# Patient Record
Sex: Female | Born: 1965 | Race: White | Hispanic: No | Marital: Married | Smoking: Never smoker
Health system: Southern US, Community
[De-identification: ages and names within clinical notes are randomized; demographics above are authoritative.]

## PROBLEM LIST (undated history)

## (undated) DIAGNOSIS — F32A Depression, unspecified: Secondary | ICD-10-CM

## (undated) DIAGNOSIS — N329 Bladder disorder, unspecified: Secondary | ICD-10-CM

## (undated) DIAGNOSIS — F329 Major depressive disorder, single episode, unspecified: Secondary | ICD-10-CM

## (undated) DIAGNOSIS — D649 Anemia, unspecified: Secondary | ICD-10-CM

## (undated) DIAGNOSIS — R51 Headache: Secondary | ICD-10-CM

## (undated) DIAGNOSIS — E785 Hyperlipidemia, unspecified: Secondary | ICD-10-CM

## (undated) DIAGNOSIS — R519 Headache, unspecified: Secondary | ICD-10-CM

## (undated) DIAGNOSIS — Z90710 Acquired absence of both cervix and uterus: Secondary | ICD-10-CM

## (undated) DIAGNOSIS — I10 Essential (primary) hypertension: Secondary | ICD-10-CM

## (undated) DIAGNOSIS — R251 Tremor, unspecified: Secondary | ICD-10-CM

## (undated) DIAGNOSIS — F419 Anxiety disorder, unspecified: Secondary | ICD-10-CM

## (undated) DIAGNOSIS — R300 Dysuria: Secondary | ICD-10-CM

## (undated) HISTORY — DX: Dysuria: R30.0

## (undated) HISTORY — DX: Major depressive disorder, single episode, unspecified: F32.9

## (undated) HISTORY — DX: Hyperlipidemia, unspecified: E78.5

## (undated) HISTORY — PX: TUBAL LIGATION: SHX77

## (undated) HISTORY — DX: Tremor, unspecified: R25.1

## (undated) HISTORY — PX: CYSTOSCOPY: SUR368

## (undated) HISTORY — PX: OTHER SURGICAL HISTORY: SHX169

---

## 2014-12-16 NOTE — Patient Instructions (Addendum)
Your procedure is scheduled on:  Monday, NOV. 21, 2016  Enter through the Main Entrance of [facility] at: 6:00 A.M.  Pick up the phone at the desk and dial [phone].  Call this number if you have problems the morning of surgery: [phone].  Remember: Do NOT eat food or drink after:  MIDNIGHT SUNDAY Take these medicines the morning of surgery with a SIP OF WATER: blood pressure medication, effexor  *stop taking fish oil at this time.  Do NOT wear jewelry (body piercing), metal hair clips/bobby pins, make-up, or nail polish. Do NOT wear lotions, powders, or perfumes.  You may wear deoderant. Do NOT shave for 48 hours prior to surgery. Do NOT bring valuables to the hospital. Contacts, dentures, or bridgework may not be worn into surgery. Leave suitcase in car.  After surgery it may be brought to your room.  For patients admitted to the hospital, checkout time is 11:00 AM the day of discharge.

## 2014-12-17 ENCOUNTER — Encounter (HOSPITAL_COMMUNITY)
Admission: RE | Admit: 2014-12-17 | Discharge: 2014-12-17 | Disposition: A | Discharge status: Home / Self Care | Payer: 59 | Source: Ambulatory Visit | Attending: Obstetrics and Gynecology | Admitting: Obstetrics and Gynecology

## 2014-12-17 ENCOUNTER — Other Ambulatory Visit: Payer: Self-pay

## 2014-12-17 ENCOUNTER — Encounter (HOSPITAL_COMMUNITY): Payer: Self-pay

## 2014-12-17 DIAGNOSIS — D259 Leiomyoma of uterus, unspecified: Secondary | ICD-10-CM | POA: Insufficient documentation

## 2014-12-17 DIAGNOSIS — N926 Irregular menstruation, unspecified: Secondary | ICD-10-CM | POA: Diagnosis not present

## 2014-12-17 DIAGNOSIS — Z01818 Encounter for other preprocedural examination: Secondary | ICD-10-CM | POA: Diagnosis not present

## 2014-12-17 HISTORY — DX: Anxiety disorder, unspecified: F41.9

## 2014-12-17 HISTORY — DX: Essential (primary) hypertension: I10

## 2014-12-17 HISTORY — DX: Major depressive disorder, single episode, unspecified: F32.9

## 2014-12-17 HISTORY — DX: Anemia, unspecified: D64.9

## 2014-12-17 HISTORY — DX: Headache, unspecified: R51.9

## 2014-12-17 HISTORY — DX: Headache: R51

## 2014-12-17 HISTORY — DX: Depression, unspecified: F32.A

## 2014-12-17 HISTORY — DX: Bladder disorder, unspecified: N32.9

## 2014-12-17 LAB — COMPREHENSIVE METABOLIC PANEL
ALBUMIN: 4.3 g/dL (ref 3.5–5.0)
ALT: 16 U/L (ref 14–54)
ANION GAP: 6 (ref 5–15)
AST: 21 U/L (ref 15–41)
Alkaline Phosphatase: 68 U/L (ref 38–126)
BUN: 16 mg/dL (ref 6–20)
CHLORIDE: 104 mmol/L (ref 101–111)
CO2: 29 mmol/L (ref 22–32)
Calcium: 9.9 mg/dL (ref 8.9–10.3)
Creatinine, Ser: 0.81 mg/dL (ref 0.44–1.00)
GFR calc Af Amer: >60 mL/min (ref >60)
GFR calc non Af Amer: >60 mL/min (ref >60)
GLUCOSE: 81 mg/dL (ref 65–99)
POTASSIUM: 3.7 mmol/L (ref 3.5–5.1)
Sodium: 139 mmol/L (ref 135–145)
Total Bilirubin: 0.7 mg/dL (ref 0.3–1.2)
Total Protein: 8 g/dL (ref 6.5–8.1)

## 2014-12-17 LAB — CBC
HEMATOCRIT: 39.1 % (ref 36.0–46.0)
HEMOGLOBIN: 13.1 g/dL (ref 12.0–15.0)
MCH: 30.1 pg (ref 26.0–34.0)
MCHC: 33.5 g/dL (ref 30.0–36.0)
MCV: 89.9 fL (ref 78.0–100.0)
Platelets: 317 10*3/uL (ref 150–400)
RBC: 4.35 MIL/uL (ref 3.87–5.11)
RDW: 14.5 % (ref 11.5–15.5)
WBC: 6.7 10*3/uL (ref 4.0–10.5)

## 2014-12-17 NOTE — Pre-Procedure Instructions (Signed)
Dr. Royce Macadamia reviewed EKG from today's PAT visit and the prior EKG done 10/16 at patient's doctors office.  No new orders received proceed with surgery as scheduled.

## 2014-12-20 NOTE — Anesthesia Preprocedure Evaluation (Addendum)
Anesthesia Evaluation  Patient identified by MRN, date of birth, ID band Patient awake    Reviewed: Allergy & Precautions, H&P , NPO status , Patient's Chart, lab work & pertinent test results  Airway Mallampati: I  TM Distance: >3 FB Neck ROM: full    Dental no notable dental hx.    Pulmonary neg pulmonary ROS,    Pulmonary exam normal        Cardiovascular hypertension, Pt. on medications Normal cardiovascular exam     Neuro/Psych    GI/Hepatic negative GI ROS, Neg liver ROS,   Endo/Other  negative endocrine ROS  Renal/GU negative Renal ROS     Musculoskeletal   Abdominal Normal abdominal exam  (+)   Peds  Hematology   Anesthesia Other Findings   Reproductive/Obstetrics                            Anesthesia Physical Anesthesia Plan  ASA: II  Anesthesia Plan: General   Post-op Pain Management:    Induction: Intravenous  Airway Management Planned: Oral ETT  Additional Equipment:   Intra-op Plan:   Post-operative Plan: Extubation in OR  Informed Consent: I have reviewed the patients History and Physical, chart, labs and discussed the procedure including the risks, benefits and alternatives for the proposed anesthesia with the patient or authorized representative who has indicated his/her understanding and acceptance.     Plan Discussed with: CRNA and Surgeon  Anesthesia Plan Comments: (Discussed case with Dr. Sandford Craze. Unnecessary preop EKG returned with variable readings involving right axis shift, possible ST changes, however these changes were not contiguous in successive leads, she has not heart history and no risk factors for CAD except HTN. She has no cardiac symptoms (denies shortness of breath, chest pain with exertion, syncope, etc.) per Dr. Sandford Craze and has METS >4. Per AHA/ACC guidelines it is safe to proceed with elective case.  Veatrice Kells, MD)         Anesthesia Quick Evaluation

## 2014-12-22 NOTE — H&P (Signed)
Betty Nolan is an 49 y.o. female G40P2 with AUB, h/o cervical polyp and - someAUB, uterine fibroids for definitive management - Also c/o pelvic relaxation.  Desires LAVH, Bilateral Salpingectomy and A&P repair.  Recent negative EMB, removal of cervical polyp.  US reveals 4x5 cm fibroid ans 2x2cm fibroid.    Pertinent Gynecological History: Menses: AUB Bleeding: dysfunctional uterine bleeding Contraception: tubal ligation Sexually transmitted diseases: no past history Previous GYN Procedures: BTL  Last mammogram: normal Date: 4/16 Last pap: normal  OB History: G2, P2   Menstrual History: No LMP recorded.    Past Medical History  Diagnosis Date  . Anemia   . Hypertension   . Depression   . Anxiety   . Bladder problem     Small bladder since childhood, Cystoscope times two  . Headache     Migraines    Past Surgical History  Procedure Laterality Date  . Cystoscopy    . Left foot surgery Left     secondary to fracture  . Tubal ligation     FH: DM, CVA, MI, HTN  Social History:  reports that she has never smoked. She has never used smokeless tobacco. She reports that she drinks alcohol. She reports that she does not use illicit drugs.married  Allergies:  Allergies  Allergen Reactions  . Percocet [Oxycodone-Acetaminophen] Itching    Meds Ca+ cranberry, fish oil, MVI, Ibuprofen, Lisinopril 10/HCTZ 12.5, Effexor ER 75mg    Review of Systems  Constitutional: Negative.   Eyes: Negative.   Respiratory: Negative.   Cardiovascular: Negative.   Gastrointestinal: Positive for abdominal pain.  Genitourinary: Negative.   Musculoskeletal: Negative.   Skin: Negative.   Neurological: Negative.   Psychiatric/Behavioral:       Depression/anxiety - controlled w meds    There were no vitals taken for this visit. Physical Exam  Constitutional: She is oriented to person, place, and time. She appears well-developed and well-nourished.  HENT:  Head: Normocephalic and atraumatic.   Cardiovascular: Normal rate and regular rhythm.   Respiratory: Effort normal and breath sounds normal. No respiratory distress. She has no wheezes.  GI: Soft. Bowel sounds are normal. She exhibits no distension. There is no tenderness.  Musculoskeletal: Normal range of motion.  Neurological: She is alert and oriented to person, place, and time.  Skin: Skin is warm and dry.  Psychiatric: She has a normal mood and affect. Her behavior is normal.    Benign EMB, cervical polyp Nl Ur Cx.  Assessment/Plan: 49yo G2P2 with AUB, polyps For LAVH/BS/A&P repair Ancef for prophylaxis D/W pt r/b/a of surgery, wishes to proceed  Bovard-Stuckert, Khalila Buechner 12/22/2014, 9:12 PM

## 2014-12-23 ENCOUNTER — Encounter (HOSPITAL_COMMUNITY)
Admission: RE | Disposition: A | Discharge status: Home / Self Care | Payer: Self-pay | Source: Ambulatory Visit | Attending: Obstetrics and Gynecology

## 2014-12-23 ENCOUNTER — Observation Stay (HOSPITAL_COMMUNITY)
Admission: RE | Admit: 2014-12-23 | Discharge: 2014-12-24 | Disposition: A | Discharge status: Home / Self Care | Payer: 59 | Source: Ambulatory Visit | Attending: Obstetrics and Gynecology | Admitting: Obstetrics and Gynecology

## 2014-12-23 ENCOUNTER — Ambulatory Visit (HOSPITAL_COMMUNITY): Payer: 59 | Admitting: Anesthesiology

## 2014-12-23 ENCOUNTER — Encounter (HOSPITAL_COMMUNITY): Payer: Self-pay

## 2014-12-23 DIAGNOSIS — D259 Leiomyoma of uterus, unspecified: Secondary | ICD-10-CM | POA: Insufficient documentation

## 2014-12-23 DIAGNOSIS — Z79899 Other long term (current) drug therapy: Secondary | ICD-10-CM | POA: Diagnosis not present

## 2014-12-23 DIAGNOSIS — N879 Dysplasia of cervix uteri, unspecified: Secondary | ICD-10-CM | POA: Insufficient documentation

## 2014-12-23 DIAGNOSIS — Z90710 Acquired absence of both cervix and uterus: Secondary | ICD-10-CM

## 2014-12-23 DIAGNOSIS — F329 Major depressive disorder, single episode, unspecified: Secondary | ICD-10-CM | POA: Insufficient documentation

## 2014-12-23 DIAGNOSIS — I10 Essential (primary) hypertension: Secondary | ICD-10-CM | POA: Diagnosis not present

## 2014-12-23 DIAGNOSIS — F419 Anxiety disorder, unspecified: Secondary | ICD-10-CM | POA: Diagnosis not present

## 2014-12-23 DIAGNOSIS — N888 Other specified noninflammatory disorders of cervix uteri: Principal | ICD-10-CM | POA: Insufficient documentation

## 2014-12-23 DIAGNOSIS — Z885 Allergy status to narcotic agent status: Secondary | ICD-10-CM | POA: Diagnosis not present

## 2014-12-23 HISTORY — DX: Acquired absence of both cervix and uterus: Z90.710

## 2014-12-23 HISTORY — PX: LAPAROSCOPIC VAGINAL HYSTERECTOMY WITH SALPINGECTOMY: SHX6680

## 2014-12-23 HISTORY — PX: ANTERIOR AND POSTERIOR REPAIR: SHX5121

## 2014-12-23 LAB — PREGNANCY, URINE: PREG TEST UR: NEGATIVE

## 2014-12-23 SURGERY — HYSTERECTOMY, VAGINAL, LAPAROSCOPY-ASSISTED, WITH SALPINGECTOMY
Anesthesia: General | Site: Vagina

## 2014-12-23 MED ORDER — PROMETHAZINE HCL 25 MG/ML IJ SOLN: 6.2500 mg | INTRAMUSCULAR | Status: DC | PRN

## 2014-12-23 MED ORDER — NALOXONE HCL 0.4 MG/ML IJ SOLN: 0.4000 mg | INTRAMUSCULAR | Status: DC | PRN

## 2014-12-23 MED ORDER — IBUPROFEN 800 MG PO TABS
800.0000 mg | ORAL_TABLET | Freq: Three times a day (TID) | ORAL | Status: DC | PRN
  Administered 2014-12-23 – 2014-12-24 (×2): 800 mg via ORAL
  Filled 2014-12-23 (×2): qty 1

## 2014-12-23 MED ORDER — NEOSTIGMINE METHYLSULFATE 10 MG/10ML IV SOLN
INTRAVENOUS | Status: AC
  Filled 2014-12-23: qty 1

## 2014-12-23 MED ORDER — ONDANSETRON HCL 4 MG/2ML IJ SOLN: 4.0000 mg | Freq: Four times a day (QID) | INTRAMUSCULAR | Status: DC | PRN

## 2014-12-23 MED ORDER — DIPHENHYDRAMINE HCL 12.5 MG/5ML PO ELIX: 12.5000 mg | ORAL_SOLUTION | Freq: Four times a day (QID) | ORAL | Status: DC | PRN

## 2014-12-23 MED ORDER — FENTANYL CITRATE (PF) 100 MCG/2ML IJ SOLN
INTRAMUSCULAR | Status: AC
  Filled 2014-12-23: qty 2

## 2014-12-23 MED ORDER — DEXAMETHASONE SODIUM PHOSPHATE 10 MG/ML IJ SOLN
INTRAMUSCULAR | Status: DC | PRN
  Administered 2014-12-23: 4 mg via INTRAVENOUS

## 2014-12-23 MED ORDER — SODIUM CHLORIDE 0.9 % IJ SOLN
INTRAMUSCULAR | Status: DC | PRN
  Administered 2014-12-23: 10 mL

## 2014-12-23 MED ORDER — HYDROCODONE-ACETAMINOPHEN 5-325 MG PO TABS
1.0000 | ORAL_TABLET | ORAL | Status: DC | PRN
  Administered 2014-12-23 – 2014-12-24 (×3): 1 via ORAL
  Filled 2014-12-23 (×3): qty 1

## 2014-12-23 MED ORDER — CEFAZOLIN SODIUM-DEXTROSE 2-3 GM-% IV SOLR
2.0000 g | INTRAVENOUS | Status: AC
  Administered 2014-12-23: 2 g via INTRAVENOUS

## 2014-12-23 MED ORDER — LACTATED RINGERS IV SOLN
INTRAVENOUS | Status: DC
  Administered 2014-12-23 (×4): via INTRAVENOUS

## 2014-12-23 MED ORDER — VENLAFAXINE HCL ER 75 MG PO CP24
75.0000 mg | ORAL_CAPSULE | Freq: Every day | ORAL | Status: DC
  Administered 2014-12-24: 75 mg via ORAL
  Filled 2014-12-23: qty 1

## 2014-12-23 MED ORDER — MENTHOL 3 MG MT LOZG: 1.0000 | LOZENGE | OROMUCOSAL | Status: DC | PRN

## 2014-12-23 MED ORDER — SCOPOLAMINE 1 MG/3DAYS TD PT72
1.0000 | MEDICATED_PATCH | Freq: Once | TRANSDERMAL | Status: DC
  Administered 2014-12-23: 1.5 mg via TRANSDERMAL

## 2014-12-23 MED ORDER — SIMETHICONE 80 MG PO CHEW: 80.0000 mg | CHEWABLE_TABLET | Freq: Four times a day (QID) | ORAL | Status: DC | PRN

## 2014-12-23 MED ORDER — GLYCOPYRROLATE 0.2 MG/ML IJ SOLN
INTRAMUSCULAR | Status: AC
  Filled 2014-12-23: qty 3

## 2014-12-23 MED ORDER — KETOROLAC TROMETHAMINE 30 MG/ML IJ SOLN
INTRAMUSCULAR | Status: AC
  Filled 2014-12-23: qty 1

## 2014-12-23 MED ORDER — DIPHENHYDRAMINE HCL 25 MG PO CAPS
25.0000 mg | ORAL_CAPSULE | Freq: Four times a day (QID) | ORAL | Status: DC | PRN
  Administered 2014-12-23 – 2014-12-24 (×2): 25 mg via ORAL
  Filled 2014-12-23 (×2): qty 1

## 2014-12-23 MED ORDER — PROPOFOL 10 MG/ML IV BOLUS
INTRAVENOUS | Status: DC | PRN
  Administered 2014-12-23: 180 mg via INTRAVENOUS

## 2014-12-23 MED ORDER — LACTATED RINGERS IV SOLN
INTRAVENOUS | Status: DC
  Administered 2014-12-23 – 2014-12-24 (×2): via INTRAVENOUS

## 2014-12-23 MED ORDER — FENTANYL CITRATE (PF) 100 MCG/2ML IJ SOLN
25.0000 ug | INTRAMUSCULAR | Status: DC | PRN
  Administered 2014-12-23 (×3): 50 ug via INTRAVENOUS

## 2014-12-23 MED ORDER — ONDANSETRON HCL 4 MG/2ML IJ SOLN
INTRAMUSCULAR | Status: AC
  Filled 2014-12-23: qty 2

## 2014-12-23 MED ORDER — LACTATED RINGERS IR SOLN
Status: DC | PRN
  Administered 2014-12-23: 3000 mL

## 2014-12-23 MED ORDER — DEXAMETHASONE SODIUM PHOSPHATE 4 MG/ML IJ SOLN
INTRAMUSCULAR | Status: AC
  Filled 2014-12-23: qty 1

## 2014-12-23 MED ORDER — VASOPRESSIN 20 UNIT/ML IV SOLN
INTRAVENOUS | Status: AC
  Filled 2014-12-23: qty 1

## 2014-12-23 MED ORDER — HYDROCHLOROTHIAZIDE 12.5 MG PO CAPS
12.5000 mg | ORAL_CAPSULE | Freq: Every day | ORAL | Status: DC
  Administered 2014-12-24: 12.5 mg via ORAL
  Filled 2014-12-23: qty 1

## 2014-12-23 MED ORDER — HYDROCODONE-ACETAMINOPHEN 5-325 MG PO TABS: 2.0000 | ORAL_TABLET | ORAL | Status: DC | PRN

## 2014-12-23 MED ORDER — LIDOCAINE HCL (CARDIAC) 20 MG/ML IV SOLN
INTRAVENOUS | Status: AC
  Filled 2014-12-23: qty 5

## 2014-12-23 MED ORDER — CEFAZOLIN SODIUM-DEXTROSE 2-3 GM-% IV SOLR
INTRAVENOUS | Status: AC
  Filled 2014-12-23: qty 50

## 2014-12-23 MED ORDER — SODIUM CHLORIDE 0.9 % IJ SOLN: 9.0000 mL | INTRAMUSCULAR | Status: DC | PRN

## 2014-12-23 MED ORDER — MEPERIDINE HCL 25 MG/ML IJ SOLN: 6.2500 mg | INTRAMUSCULAR | Status: DC | PRN

## 2014-12-23 MED ORDER — FENTANYL CITRATE (PF) 100 MCG/2ML IJ SOLN
INTRAMUSCULAR | Status: DC | PRN
  Administered 2014-12-23: 50 ug via INTRAVENOUS
  Administered 2014-12-23 (×2): 100 ug via INTRAVENOUS
  Administered 2014-12-23 (×2): 50 ug via INTRAVENOUS

## 2014-12-23 MED ORDER — ONDANSETRON HCL 4 MG PO TABS: 4.0000 mg | ORAL_TABLET | Freq: Four times a day (QID) | ORAL | Status: DC | PRN

## 2014-12-23 MED ORDER — MIDAZOLAM HCL 2 MG/2ML IJ SOLN
INTRAMUSCULAR | Status: AC
  Filled 2014-12-23: qty 2

## 2014-12-23 MED ORDER — GLYCOPYRROLATE 0.2 MG/ML IJ SOLN
INTRAMUSCULAR | Status: AC
  Filled 2014-12-23: qty 1

## 2014-12-23 MED ORDER — FENTANYL CITRATE (PF) 250 MCG/5ML IJ SOLN
INTRAMUSCULAR | Status: AC
  Filled 2014-12-23: qty 5

## 2014-12-23 MED ORDER — MIDAZOLAM HCL 2 MG/2ML IJ SOLN
INTRAMUSCULAR | Status: DC | PRN
  Administered 2014-12-23: 2 mg via INTRAVENOUS

## 2014-12-23 MED ORDER — GUAIFENESIN 100 MG/5ML PO SOLN: 15.0000 mL | ORAL | Status: DC | PRN

## 2014-12-23 MED ORDER — VASOPRESSIN 20 UNIT/ML IV SOLN
INTRAVENOUS | Status: DC | PRN
  Administered 2014-12-23: 10 mL via INTRAMUSCULAR
  Administered 2014-12-23: 20 mL via INTRAMUSCULAR

## 2014-12-23 MED ORDER — DIPHENHYDRAMINE HCL 50 MG/ML IJ SOLN: 12.5000 mg | Freq: Four times a day (QID) | INTRAMUSCULAR | Status: DC | PRN

## 2014-12-23 MED ORDER — GLYCOPYRROLATE 0.2 MG/ML IJ SOLN
INTRAMUSCULAR | Status: DC | PRN
  Administered 2014-12-23: .4 mg via INTRAVENOUS
  Administered 2014-12-23: 0.2 mg via INTRAVENOUS

## 2014-12-23 MED ORDER — ESTRADIOL 0.1 MG/GM VA CREA
TOPICAL_CREAM | VAGINAL | Status: AC
  Filled 2014-12-23: qty 42.5

## 2014-12-23 MED ORDER — LACTATED RINGERS IV SOLN
INTRAVENOUS | Status: DC
  Administered 2014-12-23: 07:00:00 via INTRAVENOUS

## 2014-12-23 MED ORDER — LISINOPRIL 10 MG PO TABS
10.0000 mg | ORAL_TABLET | Freq: Every day | ORAL | Status: DC
  Administered 2014-12-24: 10 mg via ORAL
  Filled 2014-12-23: qty 1

## 2014-12-23 MED ORDER — DOCUSATE SODIUM 100 MG PO CAPS
100.0000 mg | ORAL_CAPSULE | Freq: Every day | ORAL | Status: DC
  Administered 2014-12-23 – 2014-12-24 (×2): 100 mg via ORAL
  Filled 2014-12-23 (×2): qty 1

## 2014-12-23 MED ORDER — ESTRADIOL 0.1 MG/GM VA CREA
TOPICAL_CREAM | VAGINAL | Status: DC | PRN
  Administered 2014-12-23: 1 via VAGINAL

## 2014-12-23 MED ORDER — SODIUM CHLORIDE 0.9 % IJ SOLN
INTRAMUSCULAR | Status: AC
  Filled 2014-12-23: qty 10

## 2014-12-23 MED ORDER — KETOROLAC TROMETHAMINE 30 MG/ML IJ SOLN: 30.0000 mg | Freq: Once | INTRAMUSCULAR | Status: DC | PRN

## 2014-12-23 MED ORDER — SCOPOLAMINE 1 MG/3DAYS TD PT72
MEDICATED_PATCH | TRANSDERMAL | Status: AC
  Administered 2014-12-23: 1.5 mg via TRANSDERMAL
  Filled 2014-12-23: qty 1

## 2014-12-23 MED ORDER — ROCURONIUM BROMIDE 100 MG/10ML IV SOLN
INTRAVENOUS | Status: DC | PRN
  Administered 2014-12-23 (×2): 10 mg via INTRAVENOUS
  Administered 2014-12-23: 40 mg via INTRAVENOUS

## 2014-12-23 MED ORDER — LISINOPRIL-HYDROCHLOROTHIAZIDE 10-12.5 MG PO TABS: 1.0000 | ORAL_TABLET | Freq: Every day | ORAL | Status: DC

## 2014-12-23 MED ORDER — LIDOCAINE HCL (CARDIAC) 20 MG/ML IV SOLN
INTRAVENOUS | Status: DC | PRN
  Administered 2014-12-23: 80 mg via INTRAVENOUS

## 2014-12-23 MED ORDER — HYDROMORPHONE 1 MG/ML IV SOLN
INTRAVENOUS | Status: DC
  Administered 2014-12-23: 2.1 mg via INTRAVENOUS
  Administered 2014-12-23: 12:00:00 via INTRAVENOUS
  Administered 2014-12-23: 1.5 mg via INTRAVENOUS
  Filled 2014-12-23: qty 25

## 2014-12-23 MED ORDER — SODIUM CHLORIDE 0.9 % IJ SOLN
INTRAMUSCULAR | Status: AC
  Filled 2014-12-23: qty 100

## 2014-12-23 MED ORDER — BUPIVACAINE HCL (PF) 0.25 % IJ SOLN
INTRAMUSCULAR | Status: AC
  Filled 2014-12-23: qty 30

## 2014-12-23 MED ORDER — BUPIVACAINE HCL (PF) 0.25 % IJ SOLN
INTRAMUSCULAR | Status: DC | PRN
  Administered 2014-12-23: 10 mL

## 2014-12-23 MED ORDER — ROCURONIUM BROMIDE 100 MG/10ML IV SOLN
INTRAVENOUS | Status: AC
  Filled 2014-12-23: qty 1

## 2014-12-23 MED ORDER — KETOROLAC TROMETHAMINE 30 MG/ML IJ SOLN
INTRAMUSCULAR | Status: DC | PRN
  Administered 2014-12-23: 30 mg via INTRAVENOUS

## 2014-12-23 MED ORDER — ALUM & MAG HYDROXIDE-SIMETH 200-200-20 MG/5ML PO SUSP: 30.0000 mL | ORAL | Status: DC | PRN

## 2014-12-23 MED ORDER — PROPOFOL 10 MG/ML IV BOLUS
INTRAVENOUS | Status: AC
  Filled 2014-12-23: qty 20

## 2014-12-23 MED ORDER — NEOSTIGMINE METHYLSULFATE 10 MG/10ML IV SOLN
INTRAVENOUS | Status: DC | PRN
  Administered 2014-12-23: 3 mg via INTRAVENOUS

## 2014-12-23 MED ORDER — ONDANSETRON HCL 4 MG/2ML IJ SOLN
INTRAMUSCULAR | Status: DC | PRN
  Administered 2014-12-23: 4 mg via INTRAVENOUS

## 2014-12-23 MED ORDER — GLYCOPYRROLATE 0.2 MG/ML IJ SOLN
INTRAMUSCULAR | Status: AC
Start: 2014-12-23 — End: 2014-12-23
  Filled 2014-12-23: qty 1

## 2014-12-23 SURGICAL SUPPLY — 54 items
BLADE SURG 15 STRL LF C SS BP (BLADE) ×2 IMPLANT
BLADE SURG 15 STRL SS (BLADE) ×3
CABLE HIGH FREQUENCY MONO STRZ (ELECTRODE) IMPLANT
CLOTH BEACON ORANGE TIMEOUT ST (SAFETY) ×3 IMPLANT
CONT PATH 16OZ SNAP LID 3702 (MISCELLANEOUS) ×3 IMPLANT
COVER BACK TABLE 60X90IN (DRAPES) ×3 IMPLANT
DECANTER SPIKE VIAL GLASS SM (MISCELLANEOUS) ×3 IMPLANT
DRSG COVADERM PLUS 2X2 (GAUZE/BANDAGES/DRESSINGS) ×6 IMPLANT
DRSG OPSITE POSTOP 3X4 (GAUZE/BANDAGES/DRESSINGS) ×1 IMPLANT
DURAPREP 26ML APPLICATOR (WOUND CARE) ×3 IMPLANT
ELECT REM PT RETURN 9FT ADLT (ELECTROSURGICAL) ×3
ELECTRODE REM PT RTRN 9FT ADLT (ELECTROSURGICAL) IMPLANT
FILTER SMOKE EVAC LAPAROSHD (FILTER) ×3 IMPLANT
GAUZE PACKING 1 X5 YD ST (GAUZE/BANDAGES/DRESSINGS) ×1 IMPLANT
GAUZE PACKING 2X5 YD STRL (GAUZE/BANDAGES/DRESSINGS) ×2 IMPLANT
GLOVE BIO SURGEON STRL SZ 6.5 (GLOVE) ×9 IMPLANT
GLOVE BIOGEL PI IND STRL 7.0 (GLOVE) ×6 IMPLANT
GLOVE BIOGEL PI INDICATOR 7.0 (GLOVE) ×3
GOWN STRL REUS W/TWL LRG LVL3 (GOWN DISPOSABLE) ×12 IMPLANT
LEGGING LITHOTOMY PAIR STRL (DRAPES) ×3 IMPLANT
NDL MAYO CATGUT SZ4 TPR NDL (NEEDLE) IMPLANT
NDL SPNL 22GX3.5 QUINCKE BK (NEEDLE) IMPLANT
NEEDLE HYPO 22GX1.5 SAFETY (NEEDLE) IMPLANT
NEEDLE INSUFFLATION 120MM (ENDOMECHANICALS) ×3 IMPLANT
NEEDLE MAYO CATGUT SZ4 (NEEDLE) IMPLANT
NEEDLE SPNL 22GX3.5 QUINCKE BK (NEEDLE) IMPLANT
NS IRRIG 1000ML POUR BTL (IV SOLUTION) ×3 IMPLANT
PACK LAVH (CUSTOM PROCEDURE TRAY) ×3 IMPLANT
PACK ROBOTIC GOWN (GOWN DISPOSABLE) ×3 IMPLANT
PACK VAGINAL WOMENS (CUSTOM PROCEDURE TRAY) ×2 IMPLANT
PAD POSITIONING PINK XL (MISCELLANEOUS) ×3 IMPLANT
SET CYSTO W/LG BORE CLAMP LF (SET/KITS/TRAYS/PACK) IMPLANT
SET IRRIG TUBING LAPAROSCOPIC (IRRIGATION / IRRIGATOR) IMPLANT
SHEARS HARMONIC ACE PLUS 36CM (ENDOMECHANICALS) ×3 IMPLANT
STRIP CLOSURE SKIN 1/4X3 (GAUZE/BANDAGES/DRESSINGS) IMPLANT
SUT PROLENE 1 CTX 30  8455H (SUTURE)
SUT PROLENE 1 CTX 30 8455H (SUTURE) IMPLANT
SUT SILK 0 SH 30 (SUTURE) ×2 IMPLANT
SUT VIC AB 1 CT1 18XBRD ANBCTR (SUTURE) ×4 IMPLANT
SUT VIC AB 1 CT1 8-18 (SUTURE) ×6
SUT VIC AB 2-0 CT1 (SUTURE) ×3 IMPLANT
SUT VIC AB 2-0 CT2 27 (SUTURE) ×12 IMPLANT
SUT VIC AB 3-0 CT1 27 (SUTURE)
SUT VIC AB 3-0 CT1 TAPERPNT 27 (SUTURE) IMPLANT
SUT VIC AB 3-0 SH 27 (SUTURE) ×6
SUT VIC AB 3-0 SH 27X BRD (SUTURE) ×4 IMPLANT
SUT VICRYL 0 TIES 12 18 (SUTURE) ×3 IMPLANT
SUT VICRYL 0 UR6 27IN ABS (SUTURE) IMPLANT
SUT VICRYL 4-0 PS2 18IN ABS (SUTURE) ×3 IMPLANT
TOWEL OR 17X24 6PK STRL BLUE (TOWEL DISPOSABLE) ×6 IMPLANT
TRAY FOLEY CATH SILVER 14FR (SET/KITS/TRAYS/PACK) ×3 IMPLANT
TROCAR XCEL NON-BLD 5MMX100MML (ENDOMECHANICALS) ×7 IMPLANT
WARMER LAPAROSCOPE (MISCELLANEOUS) ×3 IMPLANT
WATER STERILE IRR 1000ML POUR (IV SOLUTION) ×2 IMPLANT

## 2014-12-23 NOTE — Addendum Note (Signed)
Addendum  created 12/23/14 1443 by Raenette Rover, CRNA   Modules edited: Clinical Notes   Clinical Notes:  File: [id number]

## 2014-12-23 NOTE — Anesthesia Postprocedure Evaluation (Signed)
Anesthesia Post Note  Patient: Betty Nolan  Procedure(s) Performed: Procedure(s) (LRB): LAPAROSCOPIC ASSISTED VAGINAL HYSTERECTOMY WITH SALPINGECTOMY (Bilateral) POSTERIOR REPAIR (RECTOCELE) (N/A)  Patient location during evaluation: [facility] Anesthesia Type: General Level of consciousness: awake, awake and alert, oriented and patient cooperative Pain management: satisfactory to patient Vital Signs Assessment: post-procedure vital signs reviewed and stable Respiratory status: spontaneous breathing, nonlabored ventilation and respiratory function stable Cardiovascular status: stable Anesthetic complications: no    Last Vitals:  Filed Vitals:   12/23/14 1250 12/23/14 1350  BP: 136/89 133/54  Pulse: 88 78  Temp: 36.6 C 37.1 C  Resp: 23 19    Last Pain:  Filed Vitals:   12/23/14 1431  PainSc: 0-No pain                 Schneur Crowson L

## 2014-12-23 NOTE — Brief Op Note (Signed)
12/23/2014  9:52 AM  PATIENT:  Betty Nolan  49 y.o. female  PRE-OPERATIVE DIAGNOSIS:  Uterine Leiomyoma, irregular periods, cervical polyp  POST-OPERATIVE DIAGNOSIS:  Uterine Leiomyoma, irregular periods, cervical polyp  PROCEDURE:  Procedure(s): LAPAROSCOPIC ASSISTED VAGINAL HYSTERECTOMY WITH SALPINGECTOMY (Bilateral) POSTERIOR REPAIR (RECTOCELE) (N/A)  SURGEON:  Surgeon(s) and Role:    * Janyth Contes, MD - Primary    * Paula Compton, MD - Assisting  ANESTHESIA:   local and general  EBL:  Total I/O In: 1000 [I.V.:1000] Out: 475 [Urine:400; Blood:75]  BLOOD ADMINISTERED:none  DRAINS: Urinary Catheter (Foley)   LOCAL MEDICATIONS USED:  MARCAINE     SPECIMEN:  Source of Specimen:  uterus, cervix, B tubes  DISPOSITION OF SPECIMEN:  PATHOLOGY  COUNTS:  YES  TOURNIQUET:  * No tourniquets in log *  DICTATION: .Other Dictation: Dictation Number [id number]  PLAN OF CARE: Admit for overnight observation  PATIENT DISPOSITION:  PACU - hemodynamically stable.   Delay start of Pharmacological VTE agent (>24hrs) due to surgical blood loss or risk of bleeding: not applicable

## 2014-12-23 NOTE — Op Note (Signed)
[name], Betty Nolan NO.:  [id number]  MEDICAL RECORD NO.:  [id number]  LOCATION:  [id number]                          FACILITY:  [facility]  PHYSICIAN:  Thornell Sartorius, MD        DATE OF BIRTH:  05/25/1965  DATE OF PROCEDURE:  12/23/2014 DATE OF DISCHARGE:                              OPERATIVE REPORT   PREOPERATIVE DIAGNOSES:  Uterine leiomyoma, irregular periods, cervical polyp.  POSTOPERATIVE DIAGNOSES:  Uterine leiomyoma, irregular periods, cervical polyp.  PROCEDURE:  Laparoscopic-assisted vaginal hysterectomy with bilateral salpingectomy and posterior repair.  SURGEON:  Thornell Sartorius, M.D.  ASSISTANT:  Paula Compton, M.D.  ANESTHESIA:  Local and general.  IV FLUIDS:  1000 mL.  URINE OUTPUT:  400 mL, clear urine at the end of the procedure.  EBL:  Approximately 75 mL.  COMPLICATIONS:  None.  PATHOLOGY:  Uterus, tubes, and cervix.  DESCRIPTION OF PROCEDURE:  After informed consent was reviewed with the patient and her spouse, she was transported to the operating room, placed on the table in supine position, and placed in the Yellofin stirrups and anesthesia was induced.  Once she was comfortable, she was prepped and draped in the normal sterile fashion.  A Foley catheter was sterilely placed.  Gloves and gowns were changed.  Attention was turned to the abdominal portion of the case.  An infraumbilical incision was made, pneumoperitoneum was unable to be obtained with a Veress needle, the peritoneum was entered under direct entry without difficulty. Accessory ports were placed.  When the Foley had been placed, the Hulka manipulator was placed in the cervix.  The tube was identified easily on the patient's left and the fimbriae were grasped and the tube was excised, it was easily removed through the port.  Attention was turned to the right which in a similar fashion, the tube was identified and removed.  The uterus was grasped at the cornu and the round  ligament and cardinal ligaments were excised with the Harmonic Scalpel.  Bladder flap was created bilaterally meeting in the midline.  Small area of bleeding was controlled with Harmonic Scalpel.  Attention was turned to the vaginal portion of the case.  The Hulka was removed.  Using a heavy weighted speculum and Sims retractor, cervix was carefully circumscribed with Bovie cautery.  The anterior cul-de-sac was then entered with sharp dissection.  The posterior cul-de-sac was entered and the cervix to the level of the arteries was excised.  The uterus was flipped and delivered.  The uterosacral ligaments that had been held, after they were plicated, were tied together.  The cuff was closed with 2-0 Vicryl. The anterior was noted not to need the repair.  Posterior repair was performed using sharp and blunt dissection.  The strong supportive tissue was then plicated with figure-of-eight sutures.  The mucosa was trimmed and reapproximated with 2-0 Vicryl.  1 inch vaginal packing was placed.  Gloves and gown were changed.  Attention was turned to the abdominal portion of the case.  Brief survey revealed no bleeding, using the suction/irrigator irrigation was performed.  The ports were removed under direct visualization.  A deep suture of 4-0 Vicryl was placed and  Dermabond was applied to the skin.  The patient tolerated the procedure well.  Sponge, lap, and needle counts were correct x2 per the operating staff.     Thornell Sartorius, MD     JB/MEDQ  D:  12/23/2014  T:  12/23/2014  Job:  [id number]

## 2014-12-23 NOTE — Transfer of Care (Signed)
Immediate Anesthesia Transfer of Care Note  Patient: Betty Nolan  Procedure(s) Performed: Procedure(s): LAPAROSCOPIC ASSISTED VAGINAL HYSTERECTOMY WITH SALPINGECTOMY (Bilateral) POSTERIOR REPAIR (RECTOCELE) (N/A)  Patient Location: PACU  Anesthesia Type:General  Level of Consciousness: awake, alert  and oriented  Airway & Oxygen Therapy: Patient Spontanous Breathing and Patient connected to nasal cannula oxygen  Post-op Assessment: Report given to RN and Post -op Vital signs reviewed and stable  Post vital signs: Reviewed and stable  Last Vitals:  Filed Vitals:   12/23/14 0611  BP: 142/78  Pulse: 65  Temp: 36.7 C  Resp: 20    Complications: No apparent anesthesia complications

## 2014-12-23 NOTE — Anesthesia Procedure Notes (Signed)
Procedure Name: Intubation Date/Time: 12/23/2014 7:29 AM Performed by: Janyth Contes Pre-anesthesia Checklist: Patient identified, Emergency Drugs available, Suction available and Patient being monitored Patient Re-evaluated:Patient Re-evaluated prior to inductionOxygen Delivery Method: Circle system utilized Preoxygenation: Pre-oxygenation with 100% oxygen Intubation Type: IV induction Ventilation: Mask ventilation without difficulty Laryngoscope Size: Miller and 2 Grade View: Grade I Tube type: Oral Tube size: 7.0 mm Number of attempts: 1 Airway Equipment and Method: Stylet Placement Confirmation: ETT inserted through vocal cords under direct vision Secured at: 21 cm Tube secured with: Tape Dental Injury: Teeth and Oropharynx as per pre-operative assessment

## 2014-12-23 NOTE — Anesthesia Postprocedure Evaluation (Signed)
Anesthesia Post Note  Patient: SHAQUINA STEPIEN  Procedure(s) Performed: Procedure(s) (LRB): LAPAROSCOPIC ASSISTED VAGINAL HYSTERECTOMY WITH SALPINGECTOMY (Bilateral) POSTERIOR REPAIR (RECTOCELE) (N/A)  Patient location during evaluation: PACU Anesthesia Type: General Level of consciousness: awake and alert Pain management: pain level controlled Vital Signs Assessment: post-procedure vital signs reviewed and stable Respiratory status: spontaneous breathing Cardiovascular status: blood pressure returned to baseline Postop Assessment: No signs of nausea or vomiting Anesthetic complications: no    Last Vitals:  Filed Vitals:   12/23/14 0611 12/23/14 1000  BP: 142/78   Pulse: 65   Temp: 36.7 C 37.2 C  Resp: 20     Last Pain:  Filed Vitals:   12/23/14 1009  PainSc: 0-No pain                 Bradden Tadros JR,JOHN Halayna Blane

## 2014-12-23 NOTE — Interval H&P Note (Signed)
History and Physical Interval Note:  12/23/2014 7:11 AM  Henderson [place]  has presented today for surgery, with the diagnosis of Uterine Leiomyoma, irregular periods  The various methods of treatment have been discussed with the patient and family. After consideration of risks, benefits and other options for treatment, the patient has consented to  Procedure(s): LAPAROSCOPIC ASSISTED VAGINAL HYSTERECTOMY WITH SALPINGECTOMY (Bilateral) ANTERIOR (CYSTOCELE) AND POSTERIOR REPAIR (RECTOCELE) (N/A) as a surgical intervention .  The patient's history has been reviewed, patient examined, no change in status, stable for surgery.  I have reviewed the patient's chart and labs.  Questions were answered to the patient's satisfaction.     Bovard-Stuckert, Deondria Puryear

## 2014-12-23 NOTE — Progress Notes (Signed)
Patient ID: Betty Nolan, female   DOB: 01-May-1965, 49 y.o.   MRN: [id number] DOS Pt is awake and alert. VS are normal Output good with clear pale urine She has no complaints

## 2014-12-24 ENCOUNTER — Encounter (HOSPITAL_COMMUNITY): Payer: Self-pay | Admitting: Obstetrics and Gynecology

## 2014-12-24 DIAGNOSIS — N888 Other specified noninflammatory disorders of cervix uteri: Secondary | ICD-10-CM | POA: Diagnosis not present

## 2014-12-24 LAB — BASIC METABOLIC PANEL
Anion gap: 6 (ref 5–15)
BUN: 7 mg/dL (ref 6–20)
CHLORIDE: 100 mmol/L — AB (ref 101–111)
CO2: 29 mmol/L (ref 22–32)
CREATININE: 0.64 mg/dL (ref 0.44–1.00)
Calcium: 7.9 mg/dL — ABNORMAL LOW (ref 8.9–10.3)
GFR calc Af Amer: >60 mL/min (ref >60)
GLUCOSE: 111 mg/dL — AB (ref 65–99)
POTASSIUM: 3.1 mmol/L — AB (ref 3.5–5.1)
Sodium: 135 mmol/L (ref 135–145)

## 2014-12-24 LAB — CBC
HEMATOCRIT: 30 % — AB (ref 36.0–46.0)
Hemoglobin: 10.1 g/dL — ABNORMAL LOW (ref 12.0–15.0)
MCH: 30.7 pg (ref 26.0–34.0)
MCHC: 33.7 g/dL (ref 30.0–36.0)
MCV: 91.2 fL (ref 78.0–100.0)
Platelets: 257 10*3/uL (ref 150–400)
RBC: 3.29 MIL/uL — ABNORMAL LOW (ref 3.87–5.11)
RDW: 14.6 % (ref 11.5–15.5)
WBC: 11.6 10*3/uL — ABNORMAL HIGH (ref 4.0–10.5)

## 2014-12-24 MED ORDER — HYDROCODONE-ACETAMINOPHEN 5-325 MG PO TABS: 1.0000 | ORAL_TABLET | Freq: Four times a day (QID) | ORAL | Status: DC | PRN

## 2014-12-24 MED ORDER — IBUPROFEN 800 MG PO TABS: 800.0000 mg | ORAL_TABLET | Freq: Three times a day (TID) | ORAL | Status: DC | PRN

## 2014-12-24 MED ORDER — DIPHENHYDRAMINE HCL 25 MG PO CAPS: 25.0000 mg | ORAL_CAPSULE | ORAL | Status: DC | PRN

## 2014-12-24 NOTE — Progress Notes (Signed)
1 Day Post-Op Procedure(s) (LRB): LAPAROSCOPIC ASSISTED VAGINAL HYSTERECTOMY WITH SALPINGECTOMY (Bilateral) POSTERIOR REPAIR (RECTOCELE) (N/A)  Subjective: Patient reports incisional pain and tolerating PO.  Pain controlled.    Objective: I have reviewed patient's vital signs, intake and output and labs.  General: alert and no distress GI: soft, non-tender; bowel sounds normal; no masses,  no organomegaly Extremities: extremities normal, atraumatic, no cyanosis or edema Vaginal Bleeding: minimal  Inc C/D/I  Assessment: s/p Procedure(s): LAPAROSCOPIC ASSISTED VAGINAL HYSTERECTOMY WITH SALPINGECTOMY (Bilateral) POSTERIOR REPAIR (RECTOCELE) (N/A): stable and progressing well  Plan: Advance diet Encourage ambulation Advance to PO medication Discharge home     Bovard-Stuckert, Kolette Vey 12/24/2014, 8:38 AM

## 2014-12-24 NOTE — Discharge Summary (Addendum)
GYN discharge summary     Patient Name: JAMILYN WATCHORN DOB: 01-08-1966 MRN: [id number]  Date of admission: 12/23/2014  Date of discharge: 12/24/2014  Admitting diagnosis: Uterine Leiomyoma, irregular periods  Secondary diagnosis:  Principal Problem:   S/P laparoscopic assisted vaginal hysterectomy (LAVH)      Discharge diagnosis: s/p LAVH/B salpingectomy and posterior repair                                    Hospital course: Admitted 11/21 for LAVH/B salpingectomy posterior repair given pelvic relaxation, uterine ficroids, irregular VB.  Underwent surgery without complication, d/c POD # 1 when ambulating, voiding and pain controlled.  Hgb decreased appropriately, Cr stable.    Physical exam  Filed Vitals:   12/23/14 1830 12/23/14 2139 12/24/14 0115 12/24/14 0544  BP: 127/63 124/72 120/65 106/59  Pulse: 67 73 68 84  Temp: 97.5 F (36.4 C) 98.3 F (36.8 C) 97.8 F (36.6 C) 98.3 F (36.8 C)  TempSrc: Oral Oral Oral Oral  Resp: 16 18 18 16   Height:      Weight:      SpO2: 92% 99% 96% 97%   General: alert CV RRR Lungs CTAB Abd soft, app tender, +BS, Inc C/D/I Ext sym, NT  Labs: Lab Results  Component Value Date   WBC 11.6* 12/24/2014   HGB 10.1* 12/24/2014   HCT 30.0* 12/24/2014   MCV 91.2 12/24/2014   PLT 257 12/24/2014   CMP Latest Ref Rng 12/24/2014  Glucose 65 - 99 mg/dL 111(H)  BUN 6 - 20 mg/dL 7  Creatinine 0.44 - 1.00 mg/dL 0.64  Sodium 135 - 145 mmol/L 135  Potassium 3.5 - 5.1 mmol/L 3.1(L)  Chloride 101 - 111 mmol/L 100(L)  CO2 22 - 32 mmol/L 29  Calcium 8.9 - 10.3 mg/dL 7.9(L)  Total Protein 6.5 - 8.1 g/dL -  Total Bilirubin 0.3 - 1.2 mg/dL -  Alkaline Phos 38 - 126 U/L -  AST 15 - 41 U/L -  ALT 14 - 54 U/L -    Discharge instruction: per d/c orders  After visit meds:    Medication List    TAKE these medications        CRANBERRY PO  Take 1 tablet by mouth daily.     docusate sodium 100 MG capsule  Commonly known as:  COLACE   Take 100 mg by mouth daily.     FLINTSTONES PLUS IRON PO  Take 1 tablet by mouth daily.     HYDROcodone-acetaminophen 5-325 MG tablet  Commonly known as:  NORCO/VICODIN  Take 1-2 tablets by mouth every 6 (six) hours as needed for moderate pain or severe pain.     ibuprofen 800 MG tablet  Commonly known as:  ADVIL,MOTRIN  Take 1 tablet (800 mg total) by mouth every 8 (eight) hours as needed for moderate pain.     lisinopril-hydrochlorothiazide 10-12.5 MG tablet  Commonly known as:  PRINZIDE,ZESTORETIC  Take 1 tablet by mouth daily.     Omega 3 1000 MG Caps  Take 1 capsule by mouth daily.     venlafaxine XR 75 MG 24 hr capsule  Commonly known as:  EFFEXOR-XR  Take 75 mg by mouth daily with breakfast.        Diet: routine diet  Activity: Advance as tolerated. Pelvic rest for 6 weeks.   Outpatient follow up:2 weeks D/c with motrin, vicodin.  F/u  for pathology review in 2 weeks     12/24/2014 Janyth Contes, MD

## 2014-12-24 NOTE — Progress Notes (Signed)
Patient discharged home with significant other.. Discharge instructions reviewed with patient and she verbalized understanding... Condition stable... No equipment... Ambulated to car with Betty Nolan, NT.

## 2015-07-08 DIAGNOSIS — S199XXA Unspecified injury of neck, initial encounter: Secondary | ICD-10-CM | POA: Diagnosis not present

## 2015-07-08 DIAGNOSIS — R51 Headache: Secondary | ICD-10-CM | POA: Diagnosis not present

## 2015-07-08 DIAGNOSIS — R251 Tremor, unspecified: Secondary | ICD-10-CM | POA: Diagnosis not present

## 2015-07-08 DIAGNOSIS — M542 Cervicalgia: Secondary | ICD-10-CM | POA: Diagnosis not present

## 2015-07-08 DIAGNOSIS — Z7289 Other problems related to lifestyle: Secondary | ICD-10-CM | POA: Diagnosis not present

## 2015-07-08 DIAGNOSIS — M47812 Spondylosis without myelopathy or radiculopathy, cervical region: Secondary | ICD-10-CM | POA: Diagnosis not present

## 2015-07-09 DIAGNOSIS — G250 Essential tremor: Secondary | ICD-10-CM | POA: Diagnosis not present

## 2015-07-09 DIAGNOSIS — Z79899 Other long term (current) drug therapy: Secondary | ICD-10-CM | POA: Diagnosis not present

## 2015-07-09 DIAGNOSIS — Z6829 Body mass index (BMI) 29.0-29.9, adult: Secondary | ICD-10-CM | POA: Diagnosis not present

## 2015-08-11 DIAGNOSIS — E785 Hyperlipidemia, unspecified: Secondary | ICD-10-CM | POA: Diagnosis not present

## 2015-08-11 DIAGNOSIS — F329 Major depressive disorder, single episode, unspecified: Secondary | ICD-10-CM | POA: Diagnosis not present

## 2015-08-11 DIAGNOSIS — I10 Essential (primary) hypertension: Secondary | ICD-10-CM | POA: Diagnosis not present

## 2015-08-11 DIAGNOSIS — Z6829 Body mass index (BMI) 29.0-29.9, adult: Secondary | ICD-10-CM | POA: Diagnosis not present

## 2015-09-04 DIAGNOSIS — R319 Hematuria, unspecified: Secondary | ICD-10-CM | POA: Diagnosis not present

## 2015-09-04 DIAGNOSIS — Z6829 Body mass index (BMI) 29.0-29.9, adult: Secondary | ICD-10-CM | POA: Diagnosis not present

## 2015-09-04 DIAGNOSIS — R300 Dysuria: Secondary | ICD-10-CM | POA: Diagnosis not present

## 2015-09-17 DIAGNOSIS — R319 Hematuria, unspecified: Secondary | ICD-10-CM | POA: Diagnosis not present

## 2015-09-17 DIAGNOSIS — Z6829 Body mass index (BMI) 29.0-29.9, adult: Secondary | ICD-10-CM | POA: Diagnosis not present

## 2015-12-10 DIAGNOSIS — E785 Hyperlipidemia, unspecified: Secondary | ICD-10-CM | POA: Diagnosis not present

## 2015-12-10 DIAGNOSIS — Z0000 Encounter for general adult medical examination without abnormal findings: Secondary | ICD-10-CM | POA: Diagnosis not present

## 2015-12-10 DIAGNOSIS — I10 Essential (primary) hypertension: Secondary | ICD-10-CM | POA: Diagnosis not present

## 2015-12-10 DIAGNOSIS — F329 Major depressive disorder, single episode, unspecified: Secondary | ICD-10-CM | POA: Diagnosis not present

## 2015-12-10 DIAGNOSIS — Z131 Encounter for screening for diabetes mellitus: Secondary | ICD-10-CM | POA: Diagnosis not present

## 2016-03-03 DIAGNOSIS — R251 Tremor, unspecified: Secondary | ICD-10-CM | POA: Diagnosis not present

## 2016-03-03 DIAGNOSIS — Z6831 Body mass index (BMI) 31.0-31.9, adult: Secondary | ICD-10-CM | POA: Diagnosis not present

## 2016-03-03 DIAGNOSIS — Z79899 Other long term (current) drug therapy: Secondary | ICD-10-CM | POA: Diagnosis not present

## 2016-03-03 DIAGNOSIS — E785 Hyperlipidemia, unspecified: Secondary | ICD-10-CM | POA: Diagnosis not present

## 2016-03-08 DIAGNOSIS — Z6831 Body mass index (BMI) 31.0-31.9, adult: Secondary | ICD-10-CM | POA: Diagnosis not present

## 2016-03-08 DIAGNOSIS — R05 Cough: Secondary | ICD-10-CM | POA: Diagnosis not present

## 2016-03-08 DIAGNOSIS — J069 Acute upper respiratory infection, unspecified: Secondary | ICD-10-CM | POA: Diagnosis not present

## 2016-03-08 DIAGNOSIS — Z79899 Other long term (current) drug therapy: Secondary | ICD-10-CM | POA: Diagnosis not present

## 2016-03-26 DIAGNOSIS — N951 Menopausal and female climacteric states: Secondary | ICD-10-CM | POA: Diagnosis not present

## 2016-04-06 DIAGNOSIS — Z6831 Body mass index (BMI) 31.0-31.9, adult: Secondary | ICD-10-CM | POA: Diagnosis not present

## 2016-04-06 DIAGNOSIS — R251 Tremor, unspecified: Secondary | ICD-10-CM | POA: Diagnosis not present

## 2016-06-15 DIAGNOSIS — N951 Menopausal and female climacteric states: Secondary | ICD-10-CM | POA: Diagnosis not present

## 2016-06-15 DIAGNOSIS — Z1389 Encounter for screening for other disorder: Secondary | ICD-10-CM | POA: Diagnosis not present

## 2016-06-15 DIAGNOSIS — Z01419 Encounter for gynecological examination (general) (routine) without abnormal findings: Secondary | ICD-10-CM | POA: Diagnosis not present

## 2016-06-15 DIAGNOSIS — Z1231 Encounter for screening mammogram for malignant neoplasm of breast: Secondary | ICD-10-CM | POA: Diagnosis not present

## 2016-06-15 DIAGNOSIS — Z6828 Body mass index (BMI) 28.0-28.9, adult: Secondary | ICD-10-CM | POA: Diagnosis not present

## 2016-08-02 DIAGNOSIS — R0789 Other chest pain: Secondary | ICD-10-CM | POA: Diagnosis not present

## 2016-08-02 DIAGNOSIS — Z885 Allergy status to narcotic agent status: Secondary | ICD-10-CM | POA: Diagnosis not present

## 2016-08-02 DIAGNOSIS — R0602 Shortness of breath: Secondary | ICD-10-CM | POA: Diagnosis not present

## 2016-08-02 DIAGNOSIS — R200 Anesthesia of skin: Secondary | ICD-10-CM | POA: Diagnosis not present

## 2016-08-02 DIAGNOSIS — E782 Mixed hyperlipidemia: Secondary | ICD-10-CM | POA: Diagnosis not present

## 2016-08-02 DIAGNOSIS — F418 Other specified anxiety disorders: Secondary | ICD-10-CM | POA: Diagnosis not present

## 2016-08-02 DIAGNOSIS — R079 Chest pain, unspecified: Secondary | ICD-10-CM | POA: Diagnosis not present

## 2016-08-02 DIAGNOSIS — I10 Essential (primary) hypertension: Secondary | ICD-10-CM | POA: Diagnosis not present

## 2016-08-02 DIAGNOSIS — Z79899 Other long term (current) drug therapy: Secondary | ICD-10-CM | POA: Diagnosis not present

## 2016-08-02 DIAGNOSIS — K219 Gastro-esophageal reflux disease without esophagitis: Secondary | ICD-10-CM | POA: Diagnosis not present

## 2016-08-02 DIAGNOSIS — I340 Nonrheumatic mitral (valve) insufficiency: Secondary | ICD-10-CM | POA: Diagnosis not present

## 2016-08-02 DIAGNOSIS — R55 Syncope and collapse: Secondary | ICD-10-CM | POA: Diagnosis not present

## 2016-08-03 DIAGNOSIS — F418 Other specified anxiety disorders: Secondary | ICD-10-CM | POA: Diagnosis not present

## 2016-08-03 DIAGNOSIS — I200 Unstable angina: Secondary | ICD-10-CM | POA: Diagnosis not present

## 2016-08-03 DIAGNOSIS — E782 Mixed hyperlipidemia: Secondary | ICD-10-CM | POA: Diagnosis not present

## 2016-08-03 DIAGNOSIS — R079 Chest pain, unspecified: Secondary | ICD-10-CM | POA: Diagnosis not present

## 2016-08-03 DIAGNOSIS — I10 Essential (primary) hypertension: Secondary | ICD-10-CM | POA: Diagnosis not present

## 2016-08-03 DIAGNOSIS — I340 Nonrheumatic mitral (valve) insufficiency: Secondary | ICD-10-CM | POA: Diagnosis not present

## 2016-08-09 DIAGNOSIS — K219 Gastro-esophageal reflux disease without esophagitis: Secondary | ICD-10-CM | POA: Diagnosis not present

## 2016-08-09 DIAGNOSIS — Z09 Encounter for follow-up examination after completed treatment for conditions other than malignant neoplasm: Secondary | ICD-10-CM | POA: Diagnosis not present

## 2016-08-09 DIAGNOSIS — R0789 Other chest pain: Secondary | ICD-10-CM | POA: Diagnosis not present

## 2016-08-09 DIAGNOSIS — R251 Tremor, unspecified: Secondary | ICD-10-CM | POA: Diagnosis not present

## 2016-09-16 ENCOUNTER — Encounter: Payer: Self-pay | Admitting: Neurology

## 2016-09-16 ENCOUNTER — Ambulatory Visit (INDEPENDENT_AMBULATORY_CARE_PROVIDER_SITE_OTHER): Payer: BLUE CROSS/BLUE SHIELD | Admitting: Neurology

## 2016-09-16 VITALS — BP 148/91 | HR 74 | Ht 60.0 in | Wt 151.0 lb

## 2016-09-16 DIAGNOSIS — R251 Tremor, unspecified: Secondary | ICD-10-CM | POA: Diagnosis not present

## 2016-09-16 NOTE — Progress Notes (Signed)
Subjective:    Patient ID: Betty Nolan is a 51 y.o. female.  HPI     Star Age, MD, PhD [facility] [address] [place], [place] [address]  Dear Dr. Megan Salon,   I saw your patient, Betty Nolan, upon your kind request in my neurologic clinic today for initial consultation of her tremors. The patient is unaccompanied today. As you know, [name] is a 51 year old right-handed woman with an underlying medical history of hypertension, obesity, hyperlipidemia, depression, alcohol abuse (per chart review), reflux disease and recent hospitalization for chest pain, who reports a recent onset tremor which is intermittent. It can affect her head and neck area, upper body and both arms, typically not her legs. It comes and goes, she can go weeks without a tremor. Valium has been helpful in reducing the tremor. She does not take Valium every day but approximately once daily, nearly every day. She feels it started relatively abruptly about a year ago. She was supposed to see a local neurologist in [place] but never got around to doing that, states that she missed an appointment. I reviewed your office note from 08/09/2016. She denies drinking alcohol on a daily basis but is rather vague about it. She does drink beer after work typically 1, estimates that she may drink several on the days that she is off. She works 12 hour shifts. She works one week 3 days per week and the next week the other 4 days per week. She has no family history of Parkinson's disease or tremors. She has 1 sister and 1 brother, she has 2 children. She has no telltale triggers. She does have a history of anxiety and has been on Effexor long-acting for years. She feels that it has an helpful for her. She has mild depression but mainly takes the Effexor for anxiety. She is a nonsmoker and has reduced her caffeine intake after her recent hospitalization.  Her Past Medical History Is  Significant For: Past Medical History:  Diagnosis Date  . Anemia   . Anxiety   . Bladder problem    Small bladder since childhood, Cystoscope times two  . Depression   . Dysuria   . Headache    Migraines  . Hyperlipidemia   . Hypertension   . Major depression   . S/P laparoscopic assisted vaginal hysterectomy (LAVH) 12/23/2014  . Tremor     Her Past Surgical History Is Significant For: Past Surgical History:  Procedure Laterality Date  . ANTERIOR AND POSTERIOR REPAIR N/A 12/23/2014   Procedure: POSTERIOR REPAIR (RECTOCELE);  Surgeon: Janyth Contes, MD;  Location: [facility];  Service: Gynecology;  Laterality: N/A;  . CYSTOSCOPY    . LAPAROSCOPIC VAGINAL HYSTERECTOMY WITH SALPINGECTOMY Bilateral 12/23/2014   Procedure: LAPAROSCOPIC ASSISTED VAGINAL HYSTERECTOMY WITH SALPINGECTOMY;  Surgeon: Janyth Contes, MD;  Location: [facility];  Service: Gynecology;  Laterality: Bilateral;  . left foot surgery Left    secondary to fracture  . TUBAL LIGATION      Her Family History Is Significant For: Family History  Problem Relation Age of Onset  . Hypertension Mother   . Heart attack Father   . Hypertension Father   . Diabetes Father     Her Social History Is Significant For: Social History   Social History  . Marital status: Married    Spouse name: N/A  . Number of children: N/A  . Years of education: N/A   Social History Main Topics  . Smoking  status: Never Smoker  . Smokeless tobacco: Never Used  . Alcohol use 12.0 oz/week    20 Standard drinks or equivalent per week  . Drug use: No  . Sexual activity: Yes    Birth control/ protection: Surgical   Other Topics Concern  . None   Social History Narrative  . None    Her Allergies Are:  Allergies  Allergen Reactions  . Percocet [Oxycodone-Acetaminophen] Itching  :   Her Current Medications Are:  Outpatient Encounter Prescriptions as of 09/16/2016  Medication Sig  . atorvastatin (LIPITOR) 10 MG tablet  Take 10 mg by mouth daily.  . Calcium Carb-Cholecalciferol (CALCIUM 600+D3) 600-800 MG-UNIT TABS Take 1 tablet by mouth daily.  Marland Kitchen CRANBERRY PO Take 1 tablet by mouth daily.  . diazepam (VALIUM) 5 MG tablet Take 5 mg by mouth daily as needed for anxiety.  Marland Kitchen lisinopril-hydrochlorothiazide (PRINZIDE,ZESTORETIC) 10-12.5 MG tablet Take 1 tablet by mouth daily.  . Pediatric Multivitamins-Iron (FLINTSTONES PLUS IRON PO) Take 1 tablet by mouth daily.  Marland Kitchen venlafaxine XR (EFFEXOR-XR) 75 MG 24 hr capsule Take 75 mg by mouth daily with breakfast.  . [DISCONTINUED] docusate sodium (COLACE) 100 MG capsule Take 100 mg by mouth daily.  . [DISCONTINUED] HYDROcodone-acetaminophen (NORCO/VICODIN) 5-325 MG tablet Take 1-2 tablets by mouth every 6 (six) hours as needed for moderate pain or severe pain.  . [DISCONTINUED] ibuprofen (ADVIL,MOTRIN) 800 MG tablet Take 1 tablet (800 mg total) by mouth every 8 (eight) hours as needed for moderate pain.  . [DISCONTINUED] Omega 3 1000 MG CAPS Take 1 capsule by mouth daily.   No facility-administered encounter medications on file as of 09/16/2016.   : Review of Systems:  Out of a complete 14 point review of systems, all are reviewed and negative with the exception of these symptoms as listed below:  Review of Systems  Neurological:       Pt presents today to discuss her tremor. Pt has tremors that come in spells. Pt takes diazepam 5mg  oral daily prn for tremors. Pt estimates that she has less than 20 drinks of alcohol per week and denies caffeine consumption.    Objective:  Neurological Exam  Physical Exam Physical Examination:   Vitals:   09/16/16 1435  BP: (!) 148/91  Pulse: 74   General Examination: The patient is a very pleasant 51 y.o. female in no acute distress. She appears well-developed and well-nourished and adequately groomed.   HEENT: Normocephalic, atraumatic, pupils are equal, round and reactive to light and accommodation. She has corrective  eyeglasses. She reports she has not seen an eye doctor in over 2 years. Extraocular tracking is good without limitation to gaze excursion or nystagmus noted. Normal smooth pursuit is noted. Hearing is grossly intact. Face is symmetric with normal facial animation and normal facial sensation. Speech is clear with no dysarthria noted. There is no hypophonia.  neck is supple, full range of active and passive motion. She has an intermittent coarse head tremor, distractible.  airway examination is unremarkable.    Chest: Clear to auscultation without wheezing, rhonchi or crackles noted.  Heart: S1+S2+0, regular and normal without murmurs, rubs or gallops noted.   Abdomen: Soft, non-tender and non-distended with normal bowel sounds appreciated on auscultation.  Extremities: There is no pitting edema in the distal lower extremities bilaterally. Pedal pulses are intact.  Skin: Warm and dry without trophic changes noted.  Musculoskeletal: exam reveals no obvious joint deformities, tenderness or joint swelling or erythema.   Neurologically:  Mental  status: The patient is awake, alert and oriented in all 4 spheres. Her immediate and remote memory, attention, language skills and fund of knowledge are appropriate. There is no evidence of aphasia, agnosia, apraxia or anomia. Speech is clear with normal prosody and enunciation. Thought process is linear. Mood is normal and affect is normal.  Cranial nerves II - XII are as described above under HEENT exam. In addition: shoulder shrug is normal with equal shoulder height noted. Motor exam: Normal bulk, strength and tone is noted. There is no drift, resting tremor or rebound. She has an intermittent bilateral upper extremity tremor, right slightly more than left, distractible and not apparent when she activates the contralateral extremity. The tremor frequency and amplitude is variable. She has no lower extremity tremor.   Romberg is negative. Reflexes are 2+  throughout. Babinski: Toes are flexor bilaterally. Fine motor skills and coordination: intact with normal finger taps, normal hand movements, normal rapid alternating patting, normal foot taps and normal foot agility.  in particular, there is no decrement in amplitude. On Archimedes spiral drawing she has no significant tremor with her dominant right hand, minimal trembling with her nondominant left hand. Handwriting is not tremulous, not micrographic, legible.   Cerebellar testing: No dysmetria or intention tremor on finger to nose testing. Heel to shin is unremarkable bilaterally. There is no truncal or gait ataxia.  Sensory exam: intact to light touch in the upper and lower extremities.  Gait, station and balance: She stands easily. No veering to one side is noted. No leaning to one side is noted. Posture is age-appropriate and stance is narrow based. Gait shows normal stride length and normal pace. No problems turning are noted. Tandem walk is unremarkable. Intact toe and heel stance is noted.               Assessment and Plan:   In summary, DOLA LUNSFORD is a very pleasant 51 y.o.-year old female with an underlying medical history of hypertension, obesity, hyperlipidemia, depression, alcohol abuse (per chart review), reflux disease and recent hospitalization for chest pain, who presents for neurologic consultation of a tremor which has been intermittent over the past year. On examination she has a fairly coarse intermittent and variable tremor, which is distractible. She has no signs of parkinsonism. Her history and exam is not in keeping with essential tremor. Overall, I reassured the patient that she does not have findings in keeping with essential tremor or Parkinson's disease. We talked about potential tremor triggers. She is advised to stay well-hydrated, reduce and eliminate if possible her caffeine intake and also reduce and eliminate if possible her alcohol intake. For reassurance I suggested  we proceed with a brain MRI and we will call her with her test results. I advised her to be cautious with the use of Valium as this medication can be addicting. I did not suggest any other specific medications for her tremor for fear of side effects. I explained to her that medication that is approved for Parkinson's disease is not indicated and also I would not recommend medications that are typically utilize for essential tremor such as propranolol or Mysoline may cause side effects and especially flareup of depression in her case and sedation with a Mysoline as well as balance problems may be a concern especially in light of fairly regular alcohol consumption. I suggested she follow-up with you. I answered all her questions today and she was in agreement. Thank you very much for allowing me to participate  in the care of this nice patient. If I can be of any further assistance to you please do not hesitate to call me at [phone].  Sincerely,   Star Age, MD, PhD

## 2016-09-16 NOTE — Patient Instructions (Addendum)
Thank you for choosing [facility] for your neurological care! It was nice to meet you today! I appreciate that you entrust me with your healthcare concerns.   Here is what we discussed today and what we came up with as our plan for you:   You don't have a typical familial tremor or what we call essential tremor. You have no signs of parkinson's disease.   Please avoid caffeine. Please talk to your primary care about potentially seeing a spine specialist.   I recommend that you avoid drinking alcohol altogether.   We will do a brain scan, called MRI and call you with the test results. We will have to schedule you for this on a separate date. This test requires authorization from your insurance, and we will take care of the insurance process. You can follow up with your family doctor.   Please remember, that any kind of tremor may be exacerbated by anxiety, anger, nervousness, excitement, dehydration, sleep deprivation, by caffeine, and low blood sugar values or blood sugar fluctuations. Some medications, especially some antidepressants and lithium can cause or exacerbate tremors. Tremors may temporarily calm down or subside with the use of a benzodiazepine such as Valium or related medications and with alcohol. Be aware, however, that drinking alcohol is not an approved or appropriate treatment for tremor control and long-term use of benzodiazepines such as Valium, lorazepam, alprazolam, or clonazepam can cause habit formation, physical and psychological addiction. There are very few medications that symptomatically help with tremor reduction, none are without potential side effects.

## 2016-10-19 ENCOUNTER — Ambulatory Visit
Admission: RE | Admit: 2016-10-19 | Discharge: 2016-10-19 | Disposition: A | Discharge status: Home / Self Care | Payer: BLUE CROSS/BLUE SHIELD | Source: Ambulatory Visit | Attending: Neurology | Admitting: Neurology

## 2016-10-19 DIAGNOSIS — R251 Tremor, unspecified: Secondary | ICD-10-CM

## 2016-10-19 MED ORDER — GADOBENATE DIMEGLUMINE 529 MG/ML IV SOLN
15.0000 mL | Freq: Once | INTRAVENOUS | Status: AC | PRN
  Administered 2016-10-19: 15 mL via INTRAVENOUS

## 2016-10-20 NOTE — Progress Notes (Signed)
Please call and advise the patient that the recent scan we did was within normal limits. We did a brain MRI with and wo contrast, which showed normal findings. In particular, there were no acute findings, such as a stroke, or mass or blood products. No further action is required on this test at this time. She can FU with PCP, as discussed.   Star Age, MD, PhD

## 2016-10-21 ENCOUNTER — Telehealth: Payer: Self-pay

## 2016-10-21 NOTE — Telephone Encounter (Signed)
I called pt, spoke to pt's husband Herbie Baltimore, per DPR. I advised pt's husband that Dr. Rexene Alberts reviewed pt's MRI results and found the MRI brain was within normal limits. Dr. Rexene Alberts recommends that pt follow up with her PCP as discussed. Pt's husband verbalized understanding of results. Pt's husband had no questions at this time but was encouraged to call back if questions arise.

## 2016-10-21 NOTE — Telephone Encounter (Signed)
-----   Message from Star Age, MD sent at 10/20/2016  8:36 AM EDT ----- Please call and advise the patient that the recent scan we did was within normal limits. We did a brain MRI with and wo contrast, which showed normal findings. In particular, there were no acute findings, such as a stroke, or mass or blood products. No further action is required on this test at this time. She can FU with PCP, as discussed.   Star Age, MD, PhD

## 2016-10-28 DIAGNOSIS — E785 Hyperlipidemia, unspecified: Secondary | ICD-10-CM | POA: Diagnosis not present

## 2016-10-28 DIAGNOSIS — R251 Tremor, unspecified: Secondary | ICD-10-CM | POA: Diagnosis not present

## 2016-10-28 DIAGNOSIS — I10 Essential (primary) hypertension: Secondary | ICD-10-CM | POA: Diagnosis not present

## 2016-10-28 DIAGNOSIS — F329 Major depressive disorder, single episode, unspecified: Secondary | ICD-10-CM | POA: Diagnosis not present

## 2016-12-13 DIAGNOSIS — F329 Major depressive disorder, single episode, unspecified: Secondary | ICD-10-CM | POA: Diagnosis not present

## 2016-12-13 DIAGNOSIS — I10 Essential (primary) hypertension: Secondary | ICD-10-CM | POA: Diagnosis not present

## 2016-12-13 DIAGNOSIS — E785 Hyperlipidemia, unspecified: Secondary | ICD-10-CM | POA: Diagnosis not present

## 2016-12-13 DIAGNOSIS — Z0000 Encounter for general adult medical examination without abnormal findings: Secondary | ICD-10-CM | POA: Diagnosis not present

## 2017-03-08 DIAGNOSIS — L03213 Periorbital cellulitis: Secondary | ICD-10-CM | POA: Diagnosis not present

## 2017-03-08 DIAGNOSIS — Z6830 Body mass index (BMI) 30.0-30.9, adult: Secondary | ICD-10-CM | POA: Diagnosis not present

## 2017-04-04 DIAGNOSIS — E785 Hyperlipidemia, unspecified: Secondary | ICD-10-CM | POA: Diagnosis not present

## 2017-04-04 DIAGNOSIS — I10 Essential (primary) hypertension: Secondary | ICD-10-CM | POA: Diagnosis not present

## 2017-04-04 DIAGNOSIS — F329 Major depressive disorder, single episode, unspecified: Secondary | ICD-10-CM | POA: Diagnosis not present

## 2017-04-04 DIAGNOSIS — R251 Tremor, unspecified: Secondary | ICD-10-CM | POA: Diagnosis not present

## 2017-04-28 DIAGNOSIS — Z961 Presence of intraocular lens: Secondary | ICD-10-CM | POA: Diagnosis not present

## 2017-06-16 DIAGNOSIS — F410 Panic disorder [episodic paroxysmal anxiety] without agoraphobia: Secondary | ICD-10-CM | POA: Diagnosis not present

## 2017-06-16 DIAGNOSIS — R51 Headache: Secondary | ICD-10-CM | POA: Diagnosis not present

## 2017-06-16 DIAGNOSIS — I6789 Other cerebrovascular disease: Secondary | ICD-10-CM | POA: Diagnosis not present

## 2017-06-16 DIAGNOSIS — R251 Tremor, unspecified: Secondary | ICD-10-CM | POA: Diagnosis not present

## 2017-06-22 DIAGNOSIS — E782 Mixed hyperlipidemia: Secondary | ICD-10-CM | POA: Diagnosis not present

## 2017-06-22 DIAGNOSIS — F418 Other specified anxiety disorders: Secondary | ICD-10-CM | POA: Diagnosis not present

## 2017-06-22 DIAGNOSIS — Z6828 Body mass index (BMI) 28.0-28.9, adult: Secondary | ICD-10-CM | POA: Diagnosis not present

## 2017-06-22 DIAGNOSIS — I10 Essential (primary) hypertension: Secondary | ICD-10-CM | POA: Diagnosis not present

## 2017-06-22 DIAGNOSIS — F419 Anxiety disorder, unspecified: Secondary | ICD-10-CM | POA: Diagnosis not present

## 2017-06-22 DIAGNOSIS — R251 Tremor, unspecified: Secondary | ICD-10-CM | POA: Diagnosis not present

## 2017-07-11 DIAGNOSIS — F329 Major depressive disorder, single episode, unspecified: Secondary | ICD-10-CM | POA: Diagnosis not present

## 2017-07-11 DIAGNOSIS — F411 Generalized anxiety disorder: Secondary | ICD-10-CM | POA: Diagnosis not present

## 2017-07-11 DIAGNOSIS — R251 Tremor, unspecified: Secondary | ICD-10-CM | POA: Diagnosis not present

## 2017-07-11 DIAGNOSIS — Z6830 Body mass index (BMI) 30.0-30.9, adult: Secondary | ICD-10-CM | POA: Diagnosis not present

## 2017-07-11 DIAGNOSIS — Z1331 Encounter for screening for depression: Secondary | ICD-10-CM | POA: Diagnosis not present

## 2017-07-20 DIAGNOSIS — Z01419 Encounter for gynecological examination (general) (routine) without abnormal findings: Secondary | ICD-10-CM | POA: Diagnosis not present

## 2017-07-20 DIAGNOSIS — Z1231 Encounter for screening mammogram for malignant neoplasm of breast: Secondary | ICD-10-CM | POA: Diagnosis not present

## 2017-07-20 DIAGNOSIS — Z1389 Encounter for screening for other disorder: Secondary | ICD-10-CM | POA: Diagnosis not present

## 2017-07-20 DIAGNOSIS — Z6828 Body mass index (BMI) 28.0-28.9, adult: Secondary | ICD-10-CM | POA: Diagnosis not present

## 2017-07-20 DIAGNOSIS — Z124 Encounter for screening for malignant neoplasm of cervix: Secondary | ICD-10-CM | POA: Diagnosis not present

## 2017-08-09 DIAGNOSIS — F410 Panic disorder [episodic paroxysmal anxiety] without agoraphobia: Secondary | ICD-10-CM | POA: Diagnosis not present

## 2017-09-06 DIAGNOSIS — F411 Generalized anxiety disorder: Secondary | ICD-10-CM | POA: Diagnosis not present

## 2017-09-06 DIAGNOSIS — F410 Panic disorder [episodic paroxysmal anxiety] without agoraphobia: Secondary | ICD-10-CM | POA: Diagnosis not present

## 2017-09-20 DIAGNOSIS — F329 Major depressive disorder, single episode, unspecified: Secondary | ICD-10-CM | POA: Diagnosis not present

## 2017-09-20 DIAGNOSIS — E785 Hyperlipidemia, unspecified: Secondary | ICD-10-CM | POA: Diagnosis not present

## 2017-09-20 DIAGNOSIS — I10 Essential (primary) hypertension: Secondary | ICD-10-CM | POA: Diagnosis not present

## 2017-09-20 DIAGNOSIS — R251 Tremor, unspecified: Secondary | ICD-10-CM | POA: Diagnosis not present

## 2017-09-23 DIAGNOSIS — F329 Major depressive disorder, single episode, unspecified: Secondary | ICD-10-CM | POA: Diagnosis not present

## 2017-09-23 DIAGNOSIS — I10 Essential (primary) hypertension: Secondary | ICD-10-CM | POA: Diagnosis not present

## 2017-09-23 DIAGNOSIS — F419 Anxiety disorder, unspecified: Secondary | ICD-10-CM | POA: Diagnosis not present

## 2017-09-23 DIAGNOSIS — F445 Conversion disorder with seizures or convulsions: Secondary | ICD-10-CM | POA: Diagnosis not present

## 2017-09-23 DIAGNOSIS — R569 Unspecified convulsions: Secondary | ICD-10-CM | POA: Diagnosis not present

## 2017-09-23 DIAGNOSIS — E782 Mixed hyperlipidemia: Secondary | ICD-10-CM | POA: Diagnosis not present

## 2017-09-23 DIAGNOSIS — G2581 Restless legs syndrome: Secondary | ICD-10-CM | POA: Diagnosis not present

## 2017-09-24 DIAGNOSIS — R569 Unspecified convulsions: Secondary | ICD-10-CM | POA: Diagnosis not present

## 2017-09-24 DIAGNOSIS — I10 Essential (primary) hypertension: Secondary | ICD-10-CM | POA: Diagnosis not present

## 2017-09-24 DIAGNOSIS — F329 Major depressive disorder, single episode, unspecified: Secondary | ICD-10-CM | POA: Diagnosis not present

## 2017-09-24 DIAGNOSIS — F419 Anxiety disorder, unspecified: Secondary | ICD-10-CM | POA: Diagnosis not present

## 2017-09-25 DIAGNOSIS — I10 Essential (primary) hypertension: Secondary | ICD-10-CM | POA: Diagnosis not present

## 2017-09-25 DIAGNOSIS — F445 Conversion disorder with seizures or convulsions: Secondary | ICD-10-CM | POA: Diagnosis not present

## 2017-09-25 DIAGNOSIS — F419 Anxiety disorder, unspecified: Secondary | ICD-10-CM | POA: Diagnosis not present

## 2017-09-25 DIAGNOSIS — F329 Major depressive disorder, single episode, unspecified: Secondary | ICD-10-CM | POA: Diagnosis not present

## 2017-09-28 DIAGNOSIS — F411 Generalized anxiety disorder: Secondary | ICD-10-CM | POA: Diagnosis not present

## 2017-09-29 DIAGNOSIS — F445 Conversion disorder with seizures or convulsions: Secondary | ICD-10-CM | POA: Diagnosis not present

## 2017-09-29 DIAGNOSIS — Z6831 Body mass index (BMI) 31.0-31.9, adult: Secondary | ICD-10-CM | POA: Diagnosis not present

## 2017-09-29 DIAGNOSIS — Z09 Encounter for follow-up examination after completed treatment for conditions other than malignant neoplasm: Secondary | ICD-10-CM | POA: Diagnosis not present

## 2017-10-17 DIAGNOSIS — F410 Panic disorder [episodic paroxysmal anxiety] without agoraphobia: Secondary | ICD-10-CM | POA: Diagnosis not present

## 2017-10-17 DIAGNOSIS — Z79899 Other long term (current) drug therapy: Secondary | ICD-10-CM | POA: Diagnosis not present

## 2017-11-14 DIAGNOSIS — F410 Panic disorder [episodic paroxysmal anxiety] without agoraphobia: Secondary | ICD-10-CM | POA: Diagnosis not present

## 2017-12-08 DIAGNOSIS — J0100 Acute maxillary sinusitis, unspecified: Secondary | ICD-10-CM | POA: Diagnosis not present

## 2017-12-08 DIAGNOSIS — H6983 Other specified disorders of Eustachian tube, bilateral: Secondary | ICD-10-CM | POA: Diagnosis not present

## 2017-12-08 DIAGNOSIS — R05 Cough: Secondary | ICD-10-CM | POA: Diagnosis not present

## 2017-12-08 DIAGNOSIS — Z6832 Body mass index (BMI) 32.0-32.9, adult: Secondary | ICD-10-CM | POA: Diagnosis not present

## 2017-12-22 DIAGNOSIS — Z0000 Encounter for general adult medical examination without abnormal findings: Secondary | ICD-10-CM | POA: Diagnosis not present

## 2017-12-22 DIAGNOSIS — E785 Hyperlipidemia, unspecified: Secondary | ICD-10-CM | POA: Diagnosis not present

## 2017-12-22 DIAGNOSIS — H6122 Impacted cerumen, left ear: Secondary | ICD-10-CM | POA: Diagnosis not present

## 2017-12-22 DIAGNOSIS — I10 Essential (primary) hypertension: Secondary | ICD-10-CM | POA: Diagnosis not present

## 2017-12-22 DIAGNOSIS — F329 Major depressive disorder, single episode, unspecified: Secondary | ICD-10-CM | POA: Diagnosis not present

## 2017-12-22 DIAGNOSIS — R739 Hyperglycemia, unspecified: Secondary | ICD-10-CM | POA: Diagnosis not present

## 2018-01-18 DIAGNOSIS — R251 Tremor, unspecified: Secondary | ICD-10-CM | POA: Diagnosis not present

## 2018-01-18 DIAGNOSIS — I10 Essential (primary) hypertension: Secondary | ICD-10-CM | POA: Diagnosis not present

## 2018-01-18 DIAGNOSIS — E785 Hyperlipidemia, unspecified: Secondary | ICD-10-CM | POA: Diagnosis not present

## 2018-01-18 DIAGNOSIS — F329 Major depressive disorder, single episode, unspecified: Secondary | ICD-10-CM | POA: Diagnosis not present

## 2018-02-13 DIAGNOSIS — F331 Major depressive disorder, recurrent, moderate: Secondary | ICD-10-CM | POA: Diagnosis not present

## 2018-02-13 DIAGNOSIS — Z1211 Encounter for screening for malignant neoplasm of colon: Secondary | ICD-10-CM | POA: Diagnosis not present

## 2018-02-13 DIAGNOSIS — Z1212 Encounter for screening for malignant neoplasm of rectum: Secondary | ICD-10-CM | POA: Diagnosis not present

## 2018-03-01 DIAGNOSIS — F3175 Bipolar disorder, in partial remission, most recent episode depressed: Secondary | ICD-10-CM | POA: Diagnosis not present

## 2018-03-16 DIAGNOSIS — M5030 Other cervical disc degeneration, unspecified cervical region: Secondary | ICD-10-CM | POA: Diagnosis not present

## 2018-03-16 DIAGNOSIS — R258 Other abnormal involuntary movements: Secondary | ICD-10-CM | POA: Diagnosis not present

## 2018-03-16 DIAGNOSIS — M542 Cervicalgia: Secondary | ICD-10-CM | POA: Diagnosis not present

## 2018-03-16 DIAGNOSIS — M47812 Spondylosis without myelopathy or radiculopathy, cervical region: Secondary | ICD-10-CM | POA: Diagnosis not present

## 2018-03-16 DIAGNOSIS — R0683 Snoring: Secondary | ICD-10-CM | POA: Diagnosis not present

## 2018-03-16 DIAGNOSIS — G4710 Hypersomnia, unspecified: Secondary | ICD-10-CM | POA: Diagnosis not present

## 2018-03-16 DIAGNOSIS — R42 Dizziness and giddiness: Secondary | ICD-10-CM | POA: Diagnosis not present

## 2018-03-30 DIAGNOSIS — F419 Anxiety disorder, unspecified: Secondary | ICD-10-CM | POA: Diagnosis not present

## 2018-03-30 DIAGNOSIS — F445 Conversion disorder with seizures or convulsions: Secondary | ICD-10-CM | POA: Diagnosis not present

## 2018-03-30 DIAGNOSIS — Z6830 Body mass index (BMI) 30.0-30.9, adult: Secondary | ICD-10-CM | POA: Diagnosis not present

## 2018-04-12 DIAGNOSIS — F331 Major depressive disorder, recurrent, moderate: Secondary | ICD-10-CM | POA: Diagnosis not present

## 2018-04-12 DIAGNOSIS — F3175 Bipolar disorder, in partial remission, most recent episode depressed: Secondary | ICD-10-CM | POA: Diagnosis not present

## 2018-04-17 DIAGNOSIS — F3175 Bipolar disorder, in partial remission, most recent episode depressed: Secondary | ICD-10-CM | POA: Diagnosis not present

## 2018-04-17 DIAGNOSIS — F331 Major depressive disorder, recurrent, moderate: Secondary | ICD-10-CM | POA: Diagnosis not present

## 2018-05-16 DIAGNOSIS — F900 Attention-deficit hyperactivity disorder, predominantly inattentive type: Secondary | ICD-10-CM | POA: Diagnosis not present

## 2018-05-16 DIAGNOSIS — F331 Major depressive disorder, recurrent, moderate: Secondary | ICD-10-CM | POA: Diagnosis not present

## 2018-05-16 DIAGNOSIS — F3175 Bipolar disorder, in partial remission, most recent episode depressed: Secondary | ICD-10-CM | POA: Diagnosis not present

## 2018-05-16 DIAGNOSIS — G4753 Recurrent isolated sleep paralysis: Secondary | ICD-10-CM | POA: Diagnosis not present

## 2018-05-25 DIAGNOSIS — E785 Hyperlipidemia, unspecified: Secondary | ICD-10-CM | POA: Diagnosis not present

## 2018-05-25 DIAGNOSIS — R259 Unspecified abnormal involuntary movements: Secondary | ICD-10-CM | POA: Diagnosis not present

## 2018-05-25 DIAGNOSIS — I10 Essential (primary) hypertension: Secondary | ICD-10-CM | POA: Diagnosis not present

## 2018-05-25 DIAGNOSIS — Z79899 Other long term (current) drug therapy: Secondary | ICD-10-CM | POA: Diagnosis not present

## 2018-06-08 DIAGNOSIS — F4312 Post-traumatic stress disorder, chronic: Secondary | ICD-10-CM | POA: Diagnosis not present

## 2018-06-08 DIAGNOSIS — R4189 Other symptoms and signs involving cognitive functions and awareness: Secondary | ICD-10-CM | POA: Diagnosis not present

## 2018-07-11 DIAGNOSIS — R51 Headache: Secondary | ICD-10-CM | POA: Diagnosis not present

## 2018-07-11 DIAGNOSIS — J069 Acute upper respiratory infection, unspecified: Secondary | ICD-10-CM | POA: Diagnosis not present

## 2018-07-11 DIAGNOSIS — R109 Unspecified abdominal pain: Secondary | ICD-10-CM | POA: Diagnosis not present

## 2018-07-11 DIAGNOSIS — R197 Diarrhea, unspecified: Secondary | ICD-10-CM | POA: Diagnosis not present

## 2018-07-12 DIAGNOSIS — I10 Essential (primary) hypertension: Secondary | ICD-10-CM | POA: Diagnosis not present

## 2018-07-12 DIAGNOSIS — R05 Cough: Secondary | ICD-10-CM | POA: Diagnosis not present

## 2018-07-12 DIAGNOSIS — Z03818 Encounter for observation for suspected exposure to other biological agents ruled out: Secondary | ICD-10-CM | POA: Diagnosis not present

## 2018-07-12 DIAGNOSIS — Z79899 Other long term (current) drug therapy: Secondary | ICD-10-CM | POA: Diagnosis not present

## 2018-07-12 DIAGNOSIS — Z20828 Contact with and (suspected) exposure to other viral communicable diseases: Secondary | ICD-10-CM | POA: Diagnosis not present

## 2018-07-12 DIAGNOSIS — R0602 Shortness of breath: Secondary | ICD-10-CM | POA: Diagnosis not present

## 2018-08-08 DIAGNOSIS — G4753 Recurrent isolated sleep paralysis: Secondary | ICD-10-CM | POA: Diagnosis not present

## 2018-08-08 DIAGNOSIS — F3175 Bipolar disorder, in partial remission, most recent episode depressed: Secondary | ICD-10-CM | POA: Diagnosis not present

## 2018-08-08 DIAGNOSIS — F4312 Post-traumatic stress disorder, chronic: Secondary | ICD-10-CM | POA: Diagnosis not present

## 2018-08-08 DIAGNOSIS — F900 Attention-deficit hyperactivity disorder, predominantly inattentive type: Secondary | ICD-10-CM | POA: Diagnosis not present

## 2018-08-11 DIAGNOSIS — F4312 Post-traumatic stress disorder, chronic: Secondary | ICD-10-CM | POA: Diagnosis not present

## 2018-08-11 DIAGNOSIS — F3175 Bipolar disorder, in partial remission, most recent episode depressed: Secondary | ICD-10-CM | POA: Diagnosis not present

## 2018-09-05 DIAGNOSIS — F4312 Post-traumatic stress disorder, chronic: Secondary | ICD-10-CM | POA: Diagnosis not present

## 2018-09-05 DIAGNOSIS — F3175 Bipolar disorder, in partial remission, most recent episode depressed: Secondary | ICD-10-CM | POA: Diagnosis not present

## 2018-09-17 DIAGNOSIS — R079 Chest pain, unspecified: Secondary | ICD-10-CM | POA: Diagnosis not present

## 2018-09-17 DIAGNOSIS — E871 Hypo-osmolality and hyponatremia: Secondary | ICD-10-CM | POA: Diagnosis not present

## 2018-09-17 DIAGNOSIS — E785 Hyperlipidemia, unspecified: Secondary | ICD-10-CM | POA: Diagnosis not present

## 2018-09-17 DIAGNOSIS — R945 Abnormal results of liver function studies: Secondary | ICD-10-CM | POA: Diagnosis not present

## 2018-09-17 DIAGNOSIS — Z79899 Other long term (current) drug therapy: Secondary | ICD-10-CM | POA: Diagnosis not present

## 2018-09-17 DIAGNOSIS — I10 Essential (primary) hypertension: Secondary | ICD-10-CM | POA: Diagnosis not present

## 2018-09-17 DIAGNOSIS — R0600 Dyspnea, unspecified: Secondary | ICD-10-CM | POA: Diagnosis not present

## 2018-09-17 DIAGNOSIS — K219 Gastro-esophageal reflux disease without esophagitis: Secondary | ICD-10-CM | POA: Diagnosis not present

## 2018-09-17 DIAGNOSIS — R0789 Other chest pain: Secondary | ICD-10-CM | POA: Diagnosis not present

## 2018-09-19 DIAGNOSIS — E871 Hypo-osmolality and hyponatremia: Secondary | ICD-10-CM | POA: Diagnosis not present

## 2018-09-19 DIAGNOSIS — Z79899 Other long term (current) drug therapy: Secondary | ICD-10-CM | POA: Diagnosis not present

## 2018-09-19 DIAGNOSIS — Z6837 Body mass index (BMI) 37.0-37.9, adult: Secondary | ICD-10-CM | POA: Diagnosis not present

## 2018-09-19 DIAGNOSIS — K5909 Other constipation: Secondary | ICD-10-CM | POA: Diagnosis not present

## 2018-09-22 DIAGNOSIS — K581 Irritable bowel syndrome with constipation: Secondary | ICD-10-CM | POA: Diagnosis not present

## 2018-09-22 DIAGNOSIS — Z6837 Body mass index (BMI) 37.0-37.9, adult: Secondary | ICD-10-CM | POA: Diagnosis not present

## 2018-10-25 DIAGNOSIS — I10 Essential (primary) hypertension: Secondary | ICD-10-CM | POA: Diagnosis not present

## 2018-10-25 DIAGNOSIS — K5909 Other constipation: Secondary | ICD-10-CM | POA: Diagnosis not present

## 2018-10-25 DIAGNOSIS — E871 Hypo-osmolality and hyponatremia: Secondary | ICD-10-CM | POA: Diagnosis not present

## 2018-10-25 DIAGNOSIS — Z1331 Encounter for screening for depression: Secondary | ICD-10-CM | POA: Diagnosis not present

## 2018-10-25 DIAGNOSIS — E785 Hyperlipidemia, unspecified: Secondary | ICD-10-CM | POA: Diagnosis not present

## 2018-10-25 DIAGNOSIS — Z9181 History of falling: Secondary | ICD-10-CM | POA: Diagnosis not present

## 2018-10-31 DIAGNOSIS — F410 Panic disorder [episodic paroxysmal anxiety] without agoraphobia: Secondary | ICD-10-CM | POA: Diagnosis not present

## 2018-10-31 DIAGNOSIS — F4312 Post-traumatic stress disorder, chronic: Secondary | ICD-10-CM | POA: Diagnosis not present

## 2018-10-31 DIAGNOSIS — F331 Major depressive disorder, recurrent, moderate: Secondary | ICD-10-CM | POA: Diagnosis not present

## 2018-11-28 DIAGNOSIS — F4312 Post-traumatic stress disorder, chronic: Secondary | ICD-10-CM | POA: Diagnosis not present

## 2018-11-28 DIAGNOSIS — F331 Major depressive disorder, recurrent, moderate: Secondary | ICD-10-CM | POA: Diagnosis not present

## 2018-12-07 DIAGNOSIS — Z130 Encounter for screening for diseases of the blood and blood-forming organs and certain disorders involving the immune mechanism: Secondary | ICD-10-CM | POA: Diagnosis not present

## 2018-12-07 DIAGNOSIS — Z1389 Encounter for screening for other disorder: Secondary | ICD-10-CM | POA: Diagnosis not present

## 2018-12-07 DIAGNOSIS — Z6834 Body mass index (BMI) 34.0-34.9, adult: Secondary | ICD-10-CM | POA: Diagnosis not present

## 2018-12-07 DIAGNOSIS — Z1231 Encounter for screening mammogram for malignant neoplasm of breast: Secondary | ICD-10-CM | POA: Diagnosis not present

## 2018-12-07 DIAGNOSIS — Z01419 Encounter for gynecological examination (general) (routine) without abnormal findings: Secondary | ICD-10-CM | POA: Diagnosis not present

## 2018-12-21 ENCOUNTER — Other Ambulatory Visit: Payer: Self-pay | Admitting: Obstetrics and Gynecology

## 2018-12-21 DIAGNOSIS — R928 Other abnormal and inconclusive findings on diagnostic imaging of breast: Secondary | ICD-10-CM

## 2019-01-03 ENCOUNTER — Other Ambulatory Visit: Payer: Self-pay

## 2019-01-03 ENCOUNTER — Ambulatory Visit
Admission: RE | Admit: 2019-01-03 | Discharge: 2019-01-03 | Disposition: A | Discharge status: Home / Self Care | Payer: BLUE CROSS/BLUE SHIELD | Source: Ambulatory Visit | Attending: Obstetrics and Gynecology | Admitting: Obstetrics and Gynecology

## 2019-01-03 ENCOUNTER — Ambulatory Visit: Payer: BLUE CROSS/BLUE SHIELD

## 2019-01-03 DIAGNOSIS — R928 Other abnormal and inconclusive findings on diagnostic imaging of breast: Secondary | ICD-10-CM | POA: Diagnosis not present

## 2019-01-23 DIAGNOSIS — F329 Major depressive disorder, single episode, unspecified: Secondary | ICD-10-CM | POA: Diagnosis not present

## 2019-01-23 DIAGNOSIS — Z79899 Other long term (current) drug therapy: Secondary | ICD-10-CM | POA: Diagnosis not present

## 2019-01-23 DIAGNOSIS — I10 Essential (primary) hypertension: Secondary | ICD-10-CM | POA: Diagnosis not present

## 2019-01-23 DIAGNOSIS — R251 Tremor, unspecified: Secondary | ICD-10-CM | POA: Diagnosis not present

## 2019-01-23 DIAGNOSIS — E785 Hyperlipidemia, unspecified: Secondary | ICD-10-CM | POA: Diagnosis not present

## 2019-02-20 DIAGNOSIS — F331 Major depressive disorder, recurrent, moderate: Secondary | ICD-10-CM | POA: Diagnosis not present

## 2019-02-20 DIAGNOSIS — F4312 Post-traumatic stress disorder, chronic: Secondary | ICD-10-CM | POA: Diagnosis not present

## 2019-03-06 DIAGNOSIS — F331 Major depressive disorder, recurrent, moderate: Secondary | ICD-10-CM | POA: Diagnosis not present

## 2019-03-06 DIAGNOSIS — Z79899 Other long term (current) drug therapy: Secondary | ICD-10-CM | POA: Diagnosis not present

## 2019-03-06 DIAGNOSIS — F410 Panic disorder [episodic paroxysmal anxiety] without agoraphobia: Secondary | ICD-10-CM | POA: Diagnosis not present

## 2019-03-06 DIAGNOSIS — F4312 Post-traumatic stress disorder, chronic: Secondary | ICD-10-CM | POA: Diagnosis not present

## 2019-03-28 DIAGNOSIS — F331 Major depressive disorder, recurrent, moderate: Secondary | ICD-10-CM | POA: Diagnosis not present

## 2019-03-28 DIAGNOSIS — F410 Panic disorder [episodic paroxysmal anxiety] without agoraphobia: Secondary | ICD-10-CM | POA: Diagnosis not present

## 2019-03-28 DIAGNOSIS — Z79899 Other long term (current) drug therapy: Secondary | ICD-10-CM | POA: Diagnosis not present

## 2019-03-28 DIAGNOSIS — F4312 Post-traumatic stress disorder, chronic: Secondary | ICD-10-CM | POA: Diagnosis not present

## 2019-04-26 DIAGNOSIS — F331 Major depressive disorder, recurrent, moderate: Secondary | ICD-10-CM | POA: Diagnosis not present

## 2019-04-26 DIAGNOSIS — F4312 Post-traumatic stress disorder, chronic: Secondary | ICD-10-CM | POA: Diagnosis not present

## 2019-04-26 DIAGNOSIS — Z79899 Other long term (current) drug therapy: Secondary | ICD-10-CM | POA: Diagnosis not present

## 2019-04-26 DIAGNOSIS — F410 Panic disorder [episodic paroxysmal anxiety] without agoraphobia: Secondary | ICD-10-CM | POA: Diagnosis not present

## 2019-05-10 DIAGNOSIS — K5909 Other constipation: Secondary | ICD-10-CM | POA: Diagnosis not present

## 2019-05-10 DIAGNOSIS — E871 Hypo-osmolality and hyponatremia: Secondary | ICD-10-CM | POA: Diagnosis not present

## 2019-05-10 DIAGNOSIS — I10 Essential (primary) hypertension: Secondary | ICD-10-CM | POA: Diagnosis not present

## 2019-05-10 DIAGNOSIS — E785 Hyperlipidemia, unspecified: Secondary | ICD-10-CM | POA: Diagnosis not present

## 2019-07-19 DIAGNOSIS — F331 Major depressive disorder, recurrent, moderate: Secondary | ICD-10-CM | POA: Diagnosis not present

## 2019-07-19 DIAGNOSIS — F410 Panic disorder [episodic paroxysmal anxiety] without agoraphobia: Secondary | ICD-10-CM | POA: Diagnosis not present

## 2019-07-19 DIAGNOSIS — Z79899 Other long term (current) drug therapy: Secondary | ICD-10-CM | POA: Diagnosis not present

## 2019-07-19 DIAGNOSIS — F4312 Post-traumatic stress disorder, chronic: Secondary | ICD-10-CM | POA: Diagnosis not present

## 2019-09-12 DIAGNOSIS — F329 Major depressive disorder, single episode, unspecified: Secondary | ICD-10-CM | POA: Diagnosis not present

## 2019-09-12 DIAGNOSIS — E785 Hyperlipidemia, unspecified: Secondary | ICD-10-CM | POA: Diagnosis not present

## 2019-09-12 DIAGNOSIS — K5909 Other constipation: Secondary | ICD-10-CM | POA: Diagnosis not present

## 2019-09-12 DIAGNOSIS — I10 Essential (primary) hypertension: Secondary | ICD-10-CM | POA: Diagnosis not present

## 2019-09-13 DIAGNOSIS — I959 Hypotension, unspecified: Secondary | ICD-10-CM | POA: Diagnosis not present

## 2019-09-13 DIAGNOSIS — R531 Weakness: Secondary | ICD-10-CM | POA: Diagnosis not present

## 2019-09-13 DIAGNOSIS — F4312 Post-traumatic stress disorder, chronic: Secondary | ICD-10-CM | POA: Diagnosis not present

## 2019-09-13 DIAGNOSIS — R4781 Slurred speech: Secondary | ICD-10-CM | POA: Diagnosis not present

## 2019-09-13 DIAGNOSIS — R4182 Altered mental status, unspecified: Secondary | ICD-10-CM | POA: Diagnosis not present

## 2019-09-13 DIAGNOSIS — I10 Essential (primary) hypertension: Secondary | ICD-10-CM | POA: Diagnosis not present

## 2019-09-13 DIAGNOSIS — G252 Other specified forms of tremor: Secondary | ICD-10-CM | POA: Diagnosis not present

## 2019-09-13 DIAGNOSIS — M47812 Spondylosis without myelopathy or radiculopathy, cervical region: Secondary | ICD-10-CM | POA: Diagnosis not present

## 2019-09-13 DIAGNOSIS — K219 Gastro-esophageal reflux disease without esophagitis: Secondary | ICD-10-CM | POA: Diagnosis not present

## 2019-09-13 DIAGNOSIS — Z79899 Other long term (current) drug therapy: Secondary | ICD-10-CM | POA: Diagnosis not present

## 2019-09-13 DIAGNOSIS — M542 Cervicalgia: Secondary | ICD-10-CM | POA: Diagnosis not present

## 2019-09-13 DIAGNOSIS — R0689 Other abnormalities of breathing: Secondary | ICD-10-CM | POA: Diagnosis not present

## 2019-09-13 DIAGNOSIS — F419 Anxiety disorder, unspecified: Secondary | ICD-10-CM | POA: Diagnosis not present

## 2019-09-13 DIAGNOSIS — R251 Tremor, unspecified: Secondary | ICD-10-CM | POA: Diagnosis not present

## 2019-09-13 DIAGNOSIS — M5030 Other cervical disc degeneration, unspecified cervical region: Secondary | ICD-10-CM | POA: Diagnosis not present

## 2019-09-13 DIAGNOSIS — R0902 Hypoxemia: Secondary | ICD-10-CM | POA: Diagnosis not present

## 2019-09-13 DIAGNOSIS — E785 Hyperlipidemia, unspecified: Secondary | ICD-10-CM | POA: Diagnosis not present

## 2019-09-13 DIAGNOSIS — R479 Unspecified speech disturbances: Secondary | ICD-10-CM | POA: Diagnosis not present

## 2019-09-13 DIAGNOSIS — F331 Major depressive disorder, recurrent, moderate: Secondary | ICD-10-CM | POA: Diagnosis not present

## 2019-09-13 DIAGNOSIS — F410 Panic disorder [episodic paroxysmal anxiety] without agoraphobia: Secondary | ICD-10-CM | POA: Diagnosis not present

## 2019-09-13 DIAGNOSIS — G8191 Hemiplegia, unspecified affecting right dominant side: Secondary | ICD-10-CM | POA: Diagnosis not present

## 2019-11-08 DIAGNOSIS — F4312 Post-traumatic stress disorder, chronic: Secondary | ICD-10-CM | POA: Diagnosis not present

## 2019-11-08 DIAGNOSIS — F410 Panic disorder [episodic paroxysmal anxiety] without agoraphobia: Secondary | ICD-10-CM | POA: Diagnosis not present

## 2019-11-08 DIAGNOSIS — F331 Major depressive disorder, recurrent, moderate: Secondary | ICD-10-CM | POA: Diagnosis not present

## 2019-11-08 DIAGNOSIS — Z79899 Other long term (current) drug therapy: Secondary | ICD-10-CM | POA: Diagnosis not present

## 2019-12-05 DIAGNOSIS — F329 Major depressive disorder, single episode, unspecified: Secondary | ICD-10-CM | POA: Diagnosis not present

## 2019-12-05 DIAGNOSIS — M1712 Unilateral primary osteoarthritis, left knee: Secondary | ICD-10-CM | POA: Diagnosis not present

## 2019-12-05 DIAGNOSIS — Z1331 Encounter for screening for depression: Secondary | ICD-10-CM | POA: Diagnosis not present

## 2019-12-05 DIAGNOSIS — I10 Essential (primary) hypertension: Secondary | ICD-10-CM | POA: Diagnosis not present

## 2019-12-05 DIAGNOSIS — E785 Hyperlipidemia, unspecified: Secondary | ICD-10-CM | POA: Diagnosis not present

## 2019-12-05 DIAGNOSIS — Z0000 Encounter for general adult medical examination without abnormal findings: Secondary | ICD-10-CM | POA: Diagnosis not present

## 2019-12-10 DIAGNOSIS — Z1231 Encounter for screening mammogram for malignant neoplasm of breast: Secondary | ICD-10-CM | POA: Diagnosis not present

## 2020-01-16 DIAGNOSIS — Z131 Encounter for screening for diabetes mellitus: Secondary | ICD-10-CM | POA: Diagnosis not present

## 2020-01-16 DIAGNOSIS — F329 Major depressive disorder, single episode, unspecified: Secondary | ICD-10-CM | POA: Diagnosis not present

## 2020-01-16 DIAGNOSIS — I10 Essential (primary) hypertension: Secondary | ICD-10-CM | POA: Diagnosis not present

## 2020-01-16 DIAGNOSIS — E785 Hyperlipidemia, unspecified: Secondary | ICD-10-CM | POA: Diagnosis not present

## 2020-01-16 DIAGNOSIS — K5909 Other constipation: Secondary | ICD-10-CM | POA: Diagnosis not present

## 2020-01-28 DIAGNOSIS — J069 Acute upper respiratory infection, unspecified: Secondary | ICD-10-CM | POA: Diagnosis not present

## 2020-01-28 DIAGNOSIS — Z79899 Other long term (current) drug therapy: Secondary | ICD-10-CM | POA: Diagnosis not present

## 2020-01-28 DIAGNOSIS — R059 Cough, unspecified: Secondary | ICD-10-CM | POA: Diagnosis not present

## 2020-02-04 DIAGNOSIS — R051 Acute cough: Secondary | ICD-10-CM | POA: Diagnosis not present

## 2020-02-04 DIAGNOSIS — J209 Acute bronchitis, unspecified: Secondary | ICD-10-CM | POA: Diagnosis not present

## 2020-02-04 DIAGNOSIS — R062 Wheezing: Secondary | ICD-10-CM | POA: Diagnosis not present

## 2020-02-04 DIAGNOSIS — Z20828 Contact with and (suspected) exposure to other viral communicable diseases: Secondary | ICD-10-CM | POA: Diagnosis not present

## 2020-02-16 DIAGNOSIS — R079 Chest pain, unspecified: Secondary | ICD-10-CM | POA: Diagnosis not present

## 2020-02-16 DIAGNOSIS — U071 COVID-19: Secondary | ICD-10-CM | POA: Diagnosis not present

## 2020-02-16 DIAGNOSIS — R069 Unspecified abnormalities of breathing: Secondary | ICD-10-CM | POA: Diagnosis not present

## 2020-02-16 DIAGNOSIS — R0602 Shortness of breath: Secondary | ICD-10-CM | POA: Diagnosis not present

## 2020-02-16 DIAGNOSIS — J80 Acute respiratory distress syndrome: Secondary | ICD-10-CM | POA: Diagnosis not present

## 2020-02-16 DIAGNOSIS — R52 Pain, unspecified: Secondary | ICD-10-CM | POA: Diagnosis not present

## 2020-02-16 DIAGNOSIS — G4489 Other headache syndrome: Secondary | ICD-10-CM | POA: Diagnosis not present

## 2020-02-16 DIAGNOSIS — Z20828 Contact with and (suspected) exposure to other viral communicable diseases: Secondary | ICD-10-CM | POA: Diagnosis not present

## 2020-02-17 DIAGNOSIS — R9431 Abnormal electrocardiogram [ECG] [EKG]: Secondary | ICD-10-CM | POA: Diagnosis not present

## 2020-03-06 DIAGNOSIS — F445 Conversion disorder with seizures or convulsions: Secondary | ICD-10-CM | POA: Diagnosis not present

## 2020-03-06 DIAGNOSIS — R0602 Shortness of breath: Secondary | ICD-10-CM | POA: Diagnosis not present

## 2020-03-06 DIAGNOSIS — R0689 Other abnormalities of breathing: Secondary | ICD-10-CM | POA: Diagnosis not present

## 2020-03-06 DIAGNOSIS — R0902 Hypoxemia: Secondary | ICD-10-CM | POA: Diagnosis not present

## 2020-03-06 DIAGNOSIS — R569 Unspecified convulsions: Secondary | ICD-10-CM | POA: Diagnosis not present

## 2020-03-06 DIAGNOSIS — R000 Tachycardia, unspecified: Secondary | ICD-10-CM | POA: Diagnosis not present

## 2020-03-18 DIAGNOSIS — F4312 Post-traumatic stress disorder, chronic: Secondary | ICD-10-CM | POA: Diagnosis not present

## 2020-03-18 DIAGNOSIS — F445 Conversion disorder with seizures or convulsions: Secondary | ICD-10-CM | POA: Diagnosis not present

## 2020-03-18 DIAGNOSIS — Z79899 Other long term (current) drug therapy: Secondary | ICD-10-CM | POA: Diagnosis not present

## 2020-03-18 DIAGNOSIS — Z6837 Body mass index (BMI) 37.0-37.9, adult: Secondary | ICD-10-CM | POA: Diagnosis not present

## 2020-03-18 DIAGNOSIS — F410 Panic disorder [episodic paroxysmal anxiety] without agoraphobia: Secondary | ICD-10-CM | POA: Diagnosis not present

## 2020-03-18 DIAGNOSIS — F3175 Bipolar disorder, in partial remission, most recent episode depressed: Secondary | ICD-10-CM | POA: Diagnosis not present

## 2020-03-18 DIAGNOSIS — F331 Major depressive disorder, recurrent, moderate: Secondary | ICD-10-CM | POA: Diagnosis not present

## 2020-04-01 DIAGNOSIS — R202 Paresthesia of skin: Secondary | ICD-10-CM | POA: Diagnosis not present

## 2020-04-01 DIAGNOSIS — R064 Hyperventilation: Secondary | ICD-10-CM | POA: Diagnosis not present

## 2020-04-01 DIAGNOSIS — Z79899 Other long term (current) drug therapy: Secondary | ICD-10-CM | POA: Diagnosis not present

## 2020-04-01 DIAGNOSIS — R0689 Other abnormalities of breathing: Secondary | ICD-10-CM | POA: Diagnosis not present

## 2020-04-01 DIAGNOSIS — F419 Anxiety disorder, unspecified: Secondary | ICD-10-CM | POA: Diagnosis not present

## 2020-04-01 DIAGNOSIS — R251 Tremor, unspecified: Secondary | ICD-10-CM | POA: Diagnosis not present

## 2020-04-01 DIAGNOSIS — I10 Essential (primary) hypertension: Secondary | ICD-10-CM | POA: Diagnosis not present

## 2020-04-01 DIAGNOSIS — R110 Nausea: Secondary | ICD-10-CM | POA: Diagnosis not present

## 2020-04-07 DIAGNOSIS — Z6836 Body mass index (BMI) 36.0-36.9, adult: Secondary | ICD-10-CM | POA: Diagnosis not present

## 2020-04-07 DIAGNOSIS — F445 Conversion disorder with seizures or convulsions: Secondary | ICD-10-CM | POA: Diagnosis not present

## 2020-05-01 DIAGNOSIS — R569 Unspecified convulsions: Secondary | ICD-10-CM | POA: Diagnosis not present

## 2020-05-07 DIAGNOSIS — F445 Conversion disorder with seizures or convulsions: Secondary | ICD-10-CM | POA: Diagnosis not present

## 2020-05-07 DIAGNOSIS — Z6836 Body mass index (BMI) 36.0-36.9, adult: Secondary | ICD-10-CM | POA: Diagnosis not present

## 2020-05-10 DIAGNOSIS — F445 Conversion disorder with seizures or convulsions: Secondary | ICD-10-CM | POA: Diagnosis not present

## 2020-05-10 DIAGNOSIS — R569 Unspecified convulsions: Secondary | ICD-10-CM | POA: Diagnosis not present

## 2020-05-10 DIAGNOSIS — F29 Unspecified psychosis not due to a substance or known physiological condition: Secondary | ICD-10-CM | POA: Diagnosis not present

## 2020-05-10 DIAGNOSIS — E876 Hypokalemia: Secondary | ICD-10-CM | POA: Diagnosis not present

## 2020-05-10 DIAGNOSIS — I10 Essential (primary) hypertension: Secondary | ICD-10-CM | POA: Diagnosis not present

## 2020-05-12 DIAGNOSIS — R4182 Altered mental status, unspecified: Secondary | ICD-10-CM | POA: Diagnosis not present

## 2020-05-15 DIAGNOSIS — I959 Hypotension, unspecified: Secondary | ICD-10-CM | POA: Diagnosis not present

## 2020-05-15 DIAGNOSIS — F418 Other specified anxiety disorders: Secondary | ICD-10-CM | POA: Diagnosis not present

## 2020-05-15 DIAGNOSIS — I10 Essential (primary) hypertension: Secondary | ICD-10-CM | POA: Diagnosis not present

## 2020-05-15 DIAGNOSIS — G40909 Epilepsy, unspecified, not intractable, without status epilepticus: Secondary | ICD-10-CM | POA: Diagnosis not present

## 2020-05-15 DIAGNOSIS — M542 Cervicalgia: Secondary | ICD-10-CM | POA: Diagnosis not present

## 2020-05-15 DIAGNOSIS — R569 Unspecified convulsions: Secondary | ICD-10-CM | POA: Diagnosis not present

## 2020-05-15 DIAGNOSIS — K219 Gastro-esophageal reflux disease without esophagitis: Secondary | ICD-10-CM | POA: Diagnosis not present

## 2020-05-15 DIAGNOSIS — Z79899 Other long term (current) drug therapy: Secondary | ICD-10-CM | POA: Diagnosis not present

## 2020-05-30 DIAGNOSIS — G43909 Migraine, unspecified, not intractable, without status migrainosus: Secondary | ICD-10-CM | POA: Diagnosis not present

## 2020-05-30 DIAGNOSIS — F445 Conversion disorder with seizures or convulsions: Secondary | ICD-10-CM | POA: Diagnosis not present

## 2020-05-30 DIAGNOSIS — M542 Cervicalgia: Secondary | ICD-10-CM | POA: Diagnosis not present

## 2020-06-13 DIAGNOSIS — F445 Conversion disorder with seizures or convulsions: Secondary | ICD-10-CM | POA: Diagnosis not present

## 2020-06-13 DIAGNOSIS — F419 Anxiety disorder, unspecified: Secondary | ICD-10-CM | POA: Diagnosis not present

## 2020-06-24 DIAGNOSIS — Z79899 Other long term (current) drug therapy: Secondary | ICD-10-CM | POA: Diagnosis not present

## 2020-06-24 DIAGNOSIS — N390 Urinary tract infection, site not specified: Secondary | ICD-10-CM | POA: Diagnosis not present

## 2020-06-24 DIAGNOSIS — Z6836 Body mass index (BMI) 36.0-36.9, adult: Secondary | ICD-10-CM | POA: Diagnosis not present

## 2020-07-10 DIAGNOSIS — F331 Major depressive disorder, recurrent, moderate: Secondary | ICD-10-CM | POA: Diagnosis not present

## 2020-07-10 DIAGNOSIS — F3175 Bipolar disorder, in partial remission, most recent episode depressed: Secondary | ICD-10-CM | POA: Diagnosis not present

## 2020-07-10 DIAGNOSIS — F4312 Post-traumatic stress disorder, chronic: Secondary | ICD-10-CM | POA: Diagnosis not present

## 2020-07-10 DIAGNOSIS — F410 Panic disorder [episodic paroxysmal anxiety] without agoraphobia: Secondary | ICD-10-CM | POA: Diagnosis not present

## 2020-07-11 DIAGNOSIS — F445 Conversion disorder with seizures or convulsions: Secondary | ICD-10-CM | POA: Diagnosis not present

## 2020-07-11 DIAGNOSIS — G43909 Migraine, unspecified, not intractable, without status migrainosus: Secondary | ICD-10-CM | POA: Diagnosis not present

## 2020-08-12 DIAGNOSIS — H6092 Unspecified otitis externa, left ear: Secondary | ICD-10-CM | POA: Diagnosis not present

## 2020-08-12 DIAGNOSIS — H9202 Otalgia, left ear: Secondary | ICD-10-CM | POA: Diagnosis not present

## 2020-08-12 DIAGNOSIS — H6091 Unspecified otitis externa, right ear: Secondary | ICD-10-CM | POA: Diagnosis not present

## 2020-08-12 DIAGNOSIS — H9201 Otalgia, right ear: Secondary | ICD-10-CM | POA: Diagnosis not present

## 2020-09-10 DIAGNOSIS — E785 Hyperlipidemia, unspecified: Secondary | ICD-10-CM | POA: Diagnosis not present

## 2020-09-10 DIAGNOSIS — F331 Major depressive disorder, recurrent, moderate: Secondary | ICD-10-CM | POA: Diagnosis not present

## 2020-09-10 DIAGNOSIS — I10 Essential (primary) hypertension: Secondary | ICD-10-CM | POA: Diagnosis not present

## 2020-09-10 DIAGNOSIS — F1010 Alcohol abuse, uncomplicated: Secondary | ICD-10-CM | POA: Diagnosis not present

## 2020-10-22 DIAGNOSIS — F410 Panic disorder [episodic paroxysmal anxiety] without agoraphobia: Secondary | ICD-10-CM | POA: Diagnosis not present

## 2020-10-22 DIAGNOSIS — Z79899 Other long term (current) drug therapy: Secondary | ICD-10-CM | POA: Diagnosis not present

## 2020-10-22 DIAGNOSIS — F3175 Bipolar disorder, in partial remission, most recent episode depressed: Secondary | ICD-10-CM | POA: Diagnosis not present

## 2020-10-22 DIAGNOSIS — F4312 Post-traumatic stress disorder, chronic: Secondary | ICD-10-CM | POA: Diagnosis not present

## 2020-10-22 DIAGNOSIS — F331 Major depressive disorder, recurrent, moderate: Secondary | ICD-10-CM | POA: Diagnosis not present

## 2020-12-03 DIAGNOSIS — Z0000 Encounter for general adult medical examination without abnormal findings: Secondary | ICD-10-CM | POA: Diagnosis not present

## 2020-12-03 DIAGNOSIS — Z6832 Body mass index (BMI) 32.0-32.9, adult: Secondary | ICD-10-CM | POA: Diagnosis not present

## 2020-12-11 DIAGNOSIS — F419 Anxiety disorder, unspecified: Secondary | ICD-10-CM | POA: Diagnosis not present

## 2020-12-11 DIAGNOSIS — E785 Hyperlipidemia, unspecified: Secondary | ICD-10-CM | POA: Diagnosis not present

## 2020-12-11 DIAGNOSIS — K219 Gastro-esophageal reflux disease without esophagitis: Secondary | ICD-10-CM | POA: Diagnosis not present

## 2020-12-11 DIAGNOSIS — S098XXA Other specified injuries of head, initial encounter: Secondary | ICD-10-CM | POA: Diagnosis not present

## 2020-12-11 DIAGNOSIS — W2209XA Striking against other stationary object, initial encounter: Secondary | ICD-10-CM | POA: Diagnosis not present

## 2020-12-11 DIAGNOSIS — S060X0A Concussion without loss of consciousness, initial encounter: Secondary | ICD-10-CM | POA: Diagnosis not present

## 2020-12-11 DIAGNOSIS — R519 Headache, unspecified: Secondary | ICD-10-CM | POA: Diagnosis not present

## 2020-12-11 DIAGNOSIS — Y9301 Activity, walking, marching and hiking: Secondary | ICD-10-CM | POA: Diagnosis not present

## 2020-12-11 DIAGNOSIS — S0990XA Unspecified injury of head, initial encounter: Secondary | ICD-10-CM | POA: Diagnosis not present

## 2020-12-11 DIAGNOSIS — Y990 Civilian activity done for income or pay: Secondary | ICD-10-CM | POA: Diagnosis not present

## 2020-12-11 DIAGNOSIS — Z79899 Other long term (current) drug therapy: Secondary | ICD-10-CM | POA: Diagnosis not present

## 2020-12-11 DIAGNOSIS — I10 Essential (primary) hypertension: Secondary | ICD-10-CM | POA: Diagnosis not present

## 2021-01-09 DIAGNOSIS — F445 Conversion disorder with seizures or convulsions: Secondary | ICD-10-CM | POA: Diagnosis not present

## 2021-01-09 DIAGNOSIS — Z79899 Other long term (current) drug therapy: Secondary | ICD-10-CM | POA: Diagnosis not present

## 2021-01-09 DIAGNOSIS — G43909 Migraine, unspecified, not intractable, without status migrainosus: Secondary | ICD-10-CM | POA: Diagnosis not present

## 2021-01-28 DIAGNOSIS — Z6830 Body mass index (BMI) 30.0-30.9, adult: Secondary | ICD-10-CM | POA: Diagnosis not present

## 2021-01-28 DIAGNOSIS — R030 Elevated blood-pressure reading, without diagnosis of hypertension: Secondary | ICD-10-CM | POA: Diagnosis not present

## 2021-01-28 DIAGNOSIS — U071 COVID-19: Secondary | ICD-10-CM | POA: Diagnosis not present

## 2021-02-13 DIAGNOSIS — F445 Conversion disorder with seizures or convulsions: Secondary | ICD-10-CM | POA: Diagnosis not present

## 2021-02-13 DIAGNOSIS — G4489 Other headache syndrome: Secondary | ICD-10-CM | POA: Diagnosis not present

## 2021-02-13 DIAGNOSIS — R569 Unspecified convulsions: Secondary | ICD-10-CM | POA: Diagnosis not present

## 2021-02-13 DIAGNOSIS — M542 Cervicalgia: Secondary | ICD-10-CM | POA: Diagnosis not present

## 2021-02-13 DIAGNOSIS — G4089 Other seizures: Secondary | ICD-10-CM | POA: Diagnosis not present

## 2021-02-28 DIAGNOSIS — M545 Low back pain, unspecified: Secondary | ICD-10-CM | POA: Diagnosis not present

## 2021-02-28 DIAGNOSIS — S335XXA Sprain of ligaments of lumbar spine, initial encounter: Secondary | ICD-10-CM | POA: Diagnosis not present

## 2021-02-28 DIAGNOSIS — X58XXXA Exposure to other specified factors, initial encounter: Secondary | ICD-10-CM | POA: Diagnosis not present

## 2021-03-07 DIAGNOSIS — E785 Hyperlipidemia, unspecified: Secondary | ICD-10-CM | POA: Diagnosis not present

## 2021-03-07 DIAGNOSIS — W540XXA Bitten by dog, initial encounter: Secondary | ICD-10-CM | POA: Diagnosis not present

## 2021-03-07 DIAGNOSIS — S51051A Open bite, right elbow, initial encounter: Secondary | ICD-10-CM | POA: Diagnosis not present

## 2021-03-07 DIAGNOSIS — K219 Gastro-esophageal reflux disease without esophagitis: Secondary | ICD-10-CM | POA: Diagnosis not present

## 2021-03-07 DIAGNOSIS — I10 Essential (primary) hypertension: Secondary | ICD-10-CM | POA: Diagnosis not present

## 2021-03-07 DIAGNOSIS — S61052A Open bite of left thumb without damage to nail, initial encounter: Secondary | ICD-10-CM | POA: Diagnosis not present

## 2021-03-07 DIAGNOSIS — M25521 Pain in right elbow: Secondary | ICD-10-CM | POA: Diagnosis not present

## 2021-03-12 DIAGNOSIS — F331 Major depressive disorder, recurrent, moderate: Secondary | ICD-10-CM | POA: Diagnosis not present

## 2021-03-12 DIAGNOSIS — F900 Attention-deficit hyperactivity disorder, predominantly inattentive type: Secondary | ICD-10-CM | POA: Diagnosis not present

## 2021-04-30 DIAGNOSIS — R519 Headache, unspecified: Secondary | ICD-10-CM | POA: Diagnosis not present

## 2021-04-30 DIAGNOSIS — G40909 Epilepsy, unspecified, not intractable, without status epilepticus: Secondary | ICD-10-CM | POA: Diagnosis not present

## 2021-04-30 DIAGNOSIS — E161 Other hypoglycemia: Secondary | ICD-10-CM | POA: Diagnosis not present

## 2021-04-30 DIAGNOSIS — R569 Unspecified convulsions: Secondary | ICD-10-CM | POA: Diagnosis not present

## 2021-04-30 DIAGNOSIS — E162 Hypoglycemia, unspecified: Secondary | ICD-10-CM | POA: Diagnosis not present

## 2021-05-07 DIAGNOSIS — R569 Unspecified convulsions: Secondary | ICD-10-CM | POA: Diagnosis not present

## 2021-05-07 DIAGNOSIS — G43909 Migraine, unspecified, not intractable, without status migrainosus: Secondary | ICD-10-CM | POA: Diagnosis not present

## 2021-05-07 DIAGNOSIS — Z79899 Other long term (current) drug therapy: Secondary | ICD-10-CM | POA: Diagnosis not present

## 2021-05-14 DIAGNOSIS — I959 Hypotension, unspecified: Secondary | ICD-10-CM | POA: Diagnosis not present

## 2021-05-14 DIAGNOSIS — R569 Unspecified convulsions: Secondary | ICD-10-CM | POA: Diagnosis not present

## 2021-05-14 DIAGNOSIS — G40909 Epilepsy, unspecified, not intractable, without status epilepticus: Secondary | ICD-10-CM | POA: Diagnosis not present

## 2021-05-14 DIAGNOSIS — G4089 Other seizures: Secondary | ICD-10-CM | POA: Diagnosis not present

## 2021-05-18 DIAGNOSIS — Z79899 Other long term (current) drug therapy: Secondary | ICD-10-CM | POA: Diagnosis not present

## 2021-05-18 DIAGNOSIS — I10 Essential (primary) hypertension: Secondary | ICD-10-CM | POA: Diagnosis not present

## 2021-05-18 DIAGNOSIS — R29898 Other symptoms and signs involving the musculoskeletal system: Secondary | ICD-10-CM | POA: Diagnosis not present

## 2021-05-18 DIAGNOSIS — R569 Unspecified convulsions: Secondary | ICD-10-CM | POA: Diagnosis not present

## 2021-05-18 DIAGNOSIS — W19XXXA Unspecified fall, initial encounter: Secondary | ICD-10-CM | POA: Diagnosis not present

## 2021-05-20 DIAGNOSIS — R569 Unspecified convulsions: Secondary | ICD-10-CM | POA: Diagnosis not present

## 2021-05-25 DIAGNOSIS — F445 Conversion disorder with seizures or convulsions: Secondary | ICD-10-CM | POA: Diagnosis not present

## 2021-05-25 DIAGNOSIS — G43909 Migraine, unspecified, not intractable, without status migrainosus: Secondary | ICD-10-CM | POA: Diagnosis not present

## 2021-06-02 DIAGNOSIS — I10 Essential (primary) hypertension: Secondary | ICD-10-CM | POA: Diagnosis not present

## 2021-06-02 DIAGNOSIS — F331 Major depressive disorder, recurrent, moderate: Secondary | ICD-10-CM | POA: Diagnosis not present

## 2021-06-02 DIAGNOSIS — E785 Hyperlipidemia, unspecified: Secondary | ICD-10-CM | POA: Diagnosis not present

## 2021-06-02 DIAGNOSIS — F445 Conversion disorder with seizures or convulsions: Secondary | ICD-10-CM | POA: Diagnosis not present

## 2021-06-02 DIAGNOSIS — Z79899 Other long term (current) drug therapy: Secondary | ICD-10-CM | POA: Diagnosis not present

## 2021-06-09 DIAGNOSIS — Z79899 Other long term (current) drug therapy: Secondary | ICD-10-CM | POA: Diagnosis not present

## 2021-06-09 DIAGNOSIS — F4312 Post-traumatic stress disorder, chronic: Secondary | ICD-10-CM | POA: Diagnosis not present

## 2021-06-09 DIAGNOSIS — F900 Attention-deficit hyperactivity disorder, predominantly inattentive type: Secondary | ICD-10-CM | POA: Diagnosis not present

## 2021-06-09 DIAGNOSIS — F331 Major depressive disorder, recurrent, moderate: Secondary | ICD-10-CM | POA: Diagnosis not present

## 2021-06-19 DIAGNOSIS — G43909 Migraine, unspecified, not intractable, without status migrainosus: Secondary | ICD-10-CM | POA: Diagnosis not present

## 2021-06-19 DIAGNOSIS — Z79899 Other long term (current) drug therapy: Secondary | ICD-10-CM | POA: Diagnosis not present

## 2021-06-19 DIAGNOSIS — F445 Conversion disorder with seizures or convulsions: Secondary | ICD-10-CM | POA: Diagnosis not present

## 2021-07-09 DIAGNOSIS — F319 Bipolar disorder, unspecified: Secondary | ICD-10-CM | POA: Diagnosis not present

## 2021-07-09 DIAGNOSIS — Z79899 Other long term (current) drug therapy: Secondary | ICD-10-CM | POA: Diagnosis not present

## 2021-07-09 DIAGNOSIS — E559 Vitamin D deficiency, unspecified: Secondary | ICD-10-CM | POA: Diagnosis not present

## 2021-07-09 DIAGNOSIS — F329 Major depressive disorder, single episode, unspecified: Secondary | ICD-10-CM | POA: Diagnosis not present

## 2021-07-09 DIAGNOSIS — F411 Generalized anxiety disorder: Secondary | ICD-10-CM | POA: Diagnosis not present

## 2021-07-09 DIAGNOSIS — R569 Unspecified convulsions: Secondary | ICD-10-CM | POA: Diagnosis not present

## 2021-07-26 DIAGNOSIS — E785 Hyperlipidemia, unspecified: Secondary | ICD-10-CM | POA: Diagnosis not present

## 2021-07-26 DIAGNOSIS — L819 Disorder of pigmentation, unspecified: Secondary | ICD-10-CM | POA: Diagnosis not present

## 2021-07-26 DIAGNOSIS — I10 Essential (primary) hypertension: Secondary | ICD-10-CM | POA: Diagnosis not present

## 2021-07-26 DIAGNOSIS — M25532 Pain in left wrist: Secondary | ICD-10-CM | POA: Diagnosis not present

## 2021-07-26 DIAGNOSIS — K219 Gastro-esophageal reflux disease without esophagitis: Secondary | ICD-10-CM | POA: Diagnosis not present

## 2021-07-26 DIAGNOSIS — M25432 Effusion, left wrist: Secondary | ICD-10-CM | POA: Diagnosis not present

## 2021-07-26 DIAGNOSIS — W109XXA Fall (on) (from) unspecified stairs and steps, initial encounter: Secondary | ICD-10-CM | POA: Diagnosis not present

## 2021-07-26 DIAGNOSIS — S63502A Unspecified sprain of left wrist, initial encounter: Secondary | ICD-10-CM | POA: Diagnosis not present

## 2021-10-02 IMAGING — MG MM DIGITAL DIAGNOSTIC UNILAT*R* W/ TOMO W/ CAD
6 series · 6 of 18 positions shown · non-contrast
Comparison: Previous exam(s).

CLINICAL DATA: Patient recalled from screening for right breast
asymmetry.

EXAM:
DIGITAL DIAGNOSTIC UNILATERAL RIGHT MAMMOGRAM WITH CAD AND TOMO

[R CC synth-2D (1 of 2)]
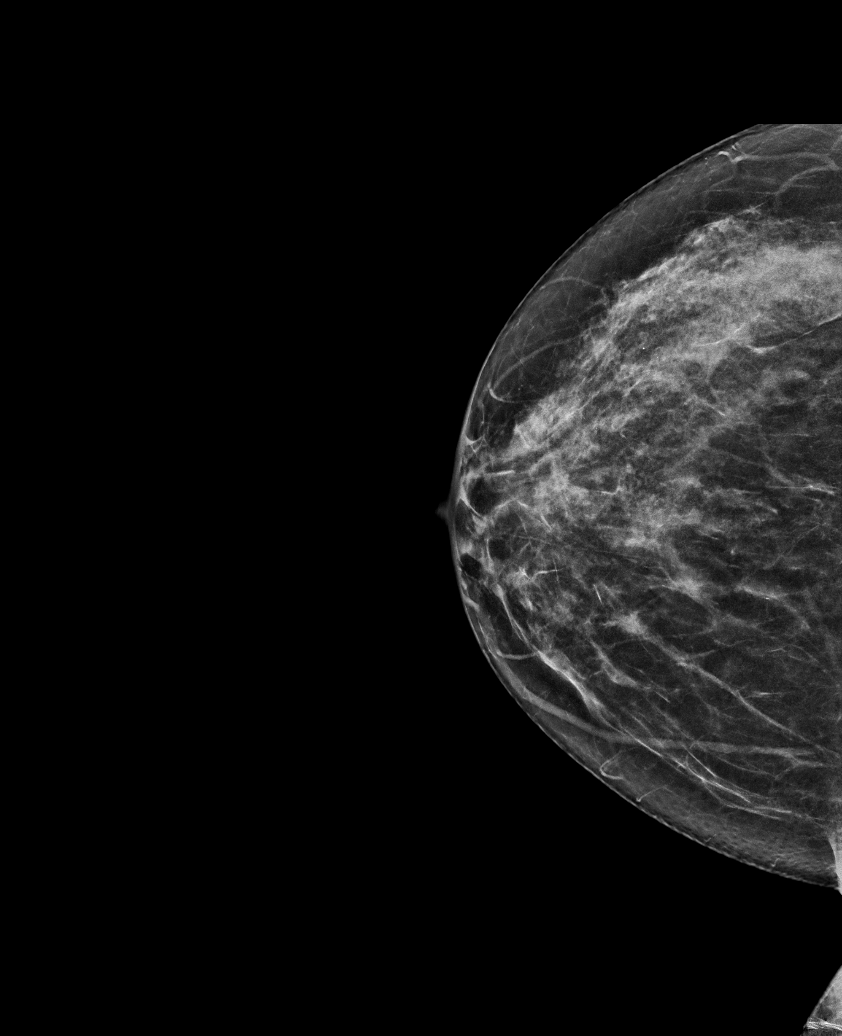

[R CC synth-2D (2 of 2)]
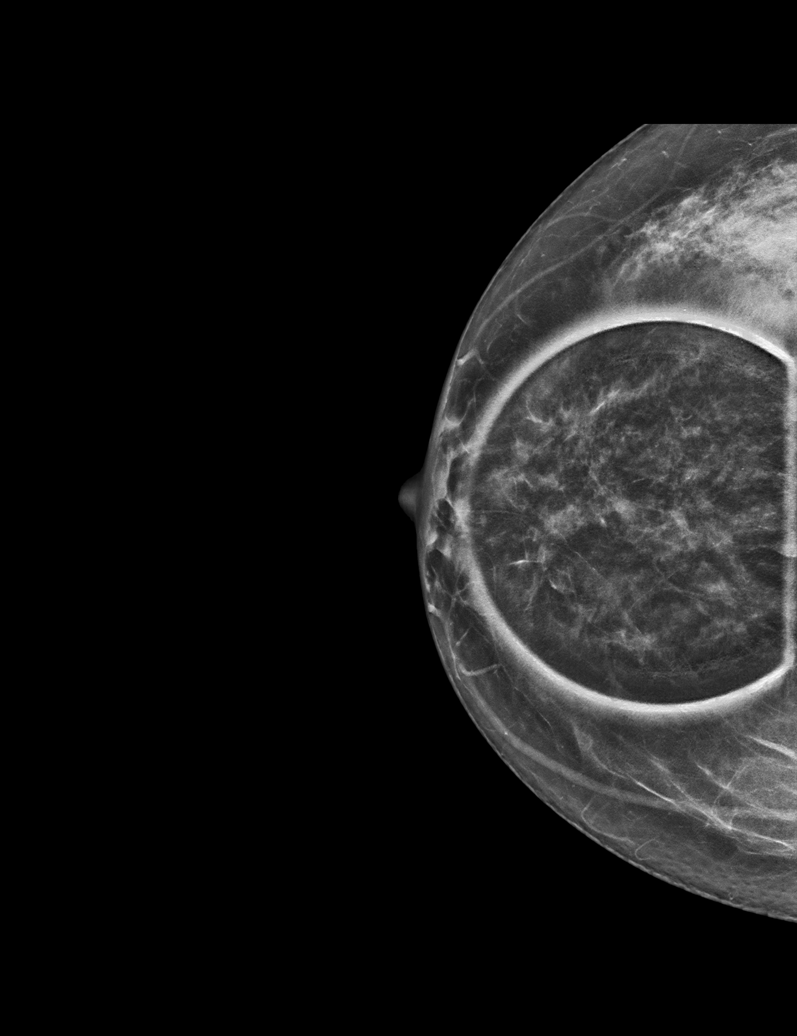

[R MLO synth-2D]
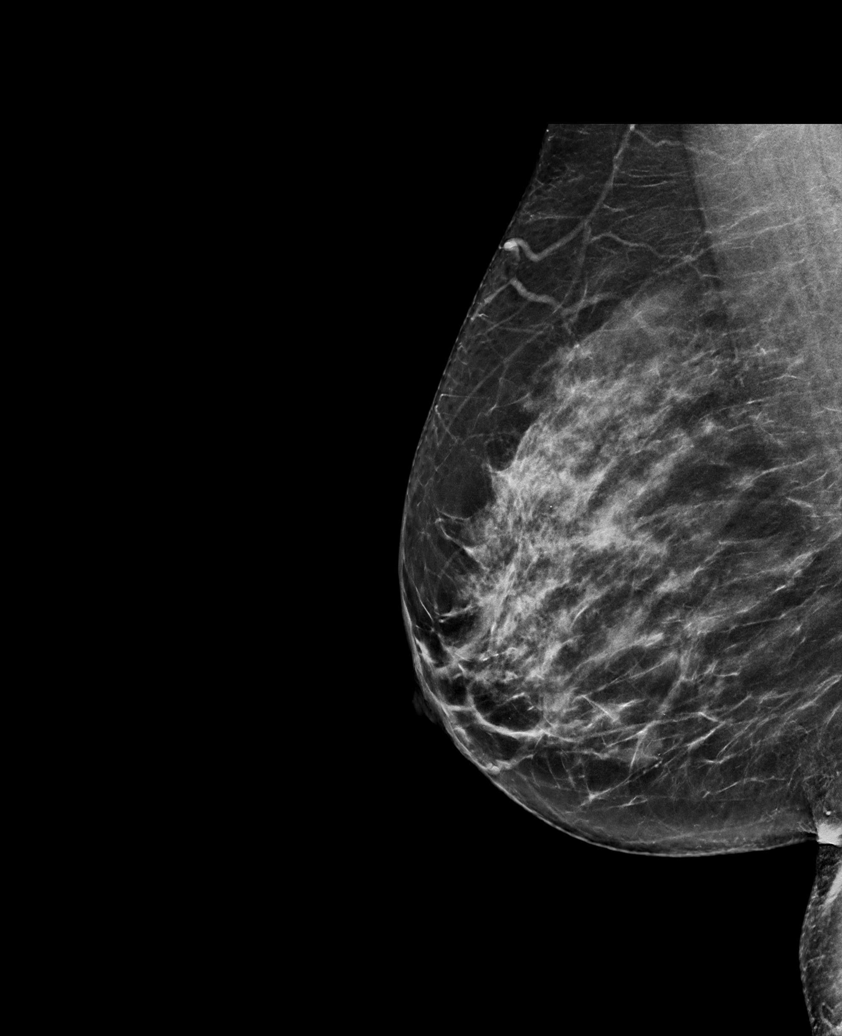

[R CC tomo (1 of 2) · tomo slice 33/64.0]
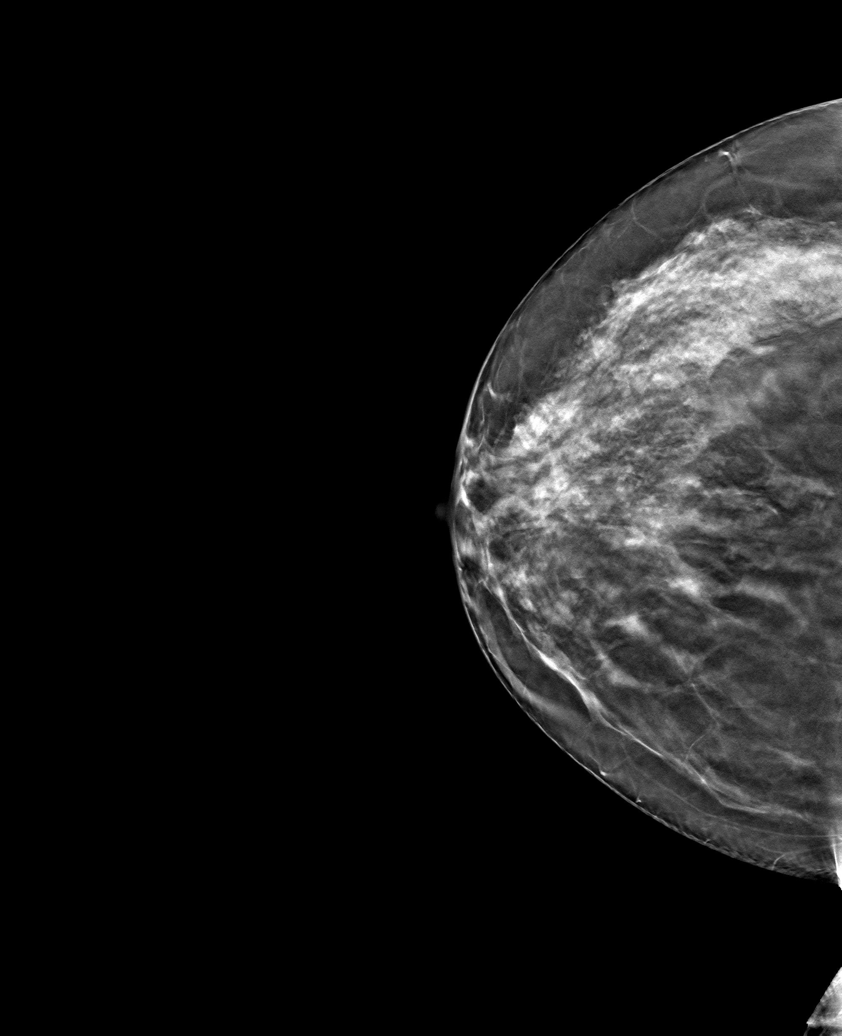

[R MLO tomo · tomo slice 37/74.0]
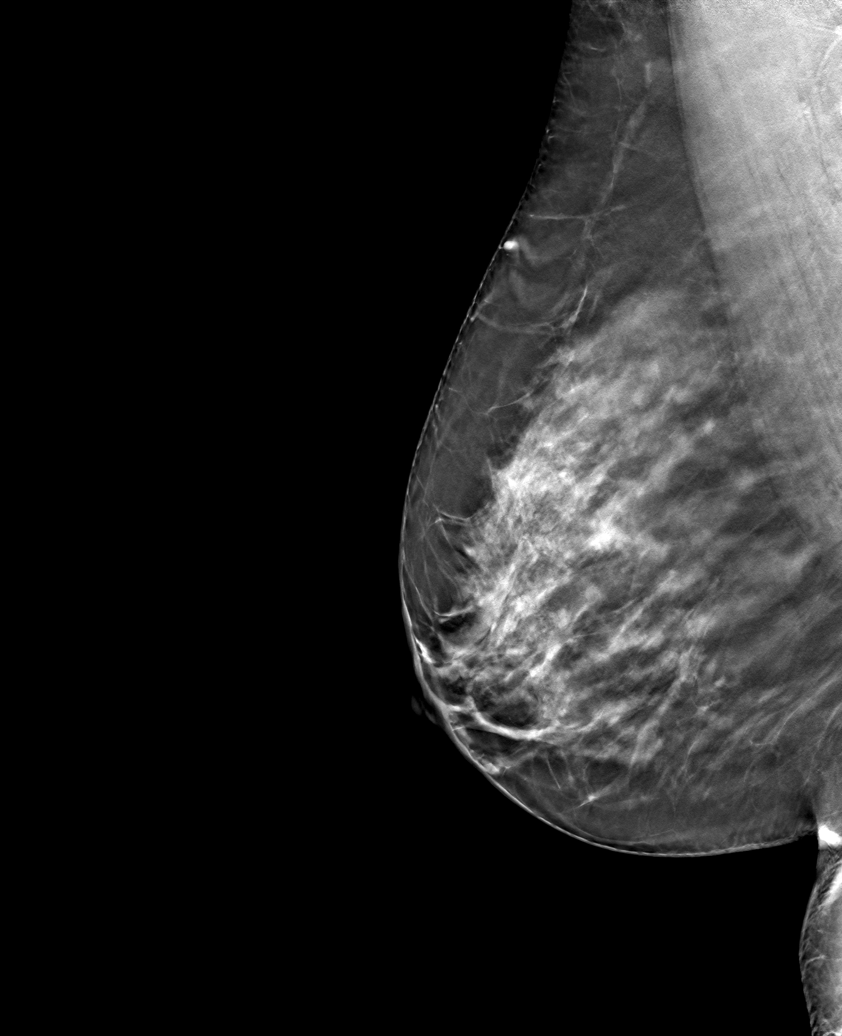

[R CC tomo (2 of 2) · tomo slice 31/60.0]
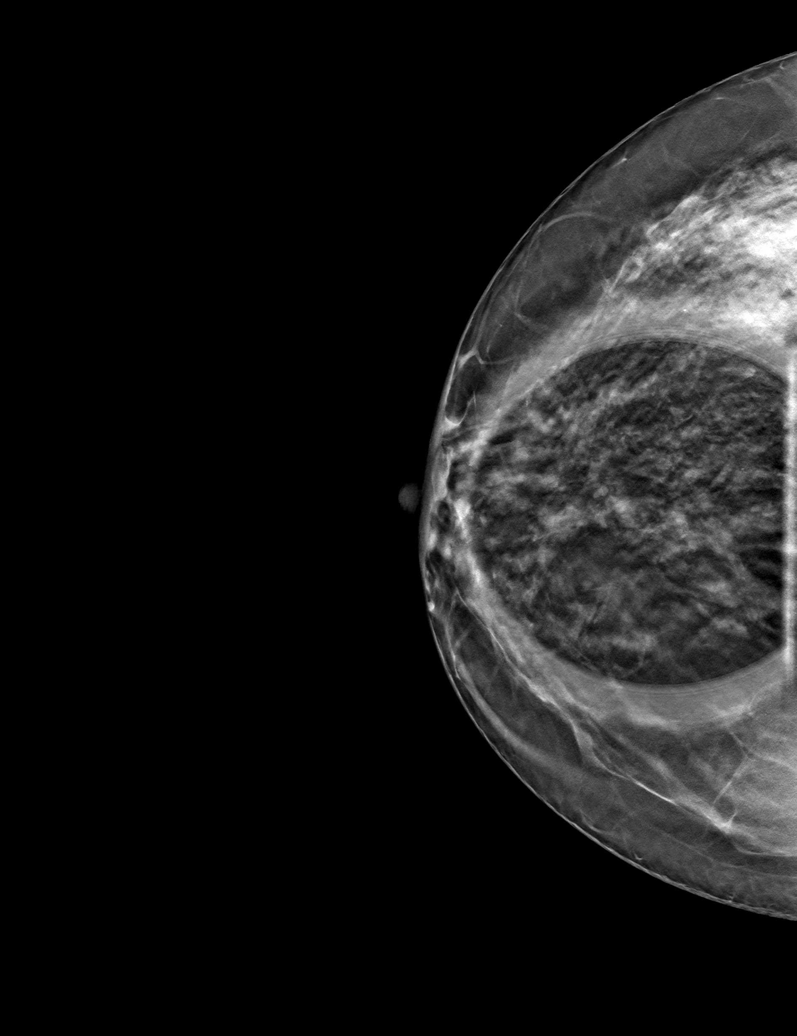

[6 of 18 positions shown; findings below may reference images not displayed]

ACR Breast Density Category c: The breast tissue is heterogeneously
dense, which may obscure small masses.
FINDINGS: Questioned asymmetry within the right breast resolved with
additional imaging including 3D CC and MLO tomosynthesis images. No
suspicious abnormalities on additional imaging.

Mammographic images were processed with CAD.
IMPRESSION: No mammographic evidence for malignancy.

RECOMMENDATION:
Screening mammogram in one year.(Code:LV-R-TTF)

I have discussed the findings and recommendations with the patient.
If applicable, a reminder letter will be sent to the patient
regarding the next appointment.

BI-RADS CATEGORY  1: Negative.
